# Patient Record
Sex: Female | Born: 1960
Health system: Southern US, Community
[De-identification: ages and names within clinical notes are randomized; demographics above are authoritative.]

## PROBLEM LIST (undated history)

## (undated) DIAGNOSIS — M1990 Unspecified osteoarthritis, unspecified site: Secondary | ICD-10-CM

## (undated) DIAGNOSIS — R011 Cardiac murmur, unspecified: Secondary | ICD-10-CM

## (undated) DIAGNOSIS — M170 Bilateral primary osteoarthritis of knee: Secondary | ICD-10-CM

## (undated) DIAGNOSIS — F419 Anxiety disorder, unspecified: Secondary | ICD-10-CM

## (undated) DIAGNOSIS — M810 Age-related osteoporosis without current pathological fracture: Secondary | ICD-10-CM

## (undated) HISTORY — DX: Age-related osteoporosis without current pathological fracture: M81.0

## (undated) HISTORY — DX: Unspecified osteoarthritis, unspecified site: M19.90

## (undated) HISTORY — DX: Anxiety disorder, unspecified: F41.9

## (undated) HISTORY — DX: Bilateral primary osteoarthritis of knee: M17.0

## (undated) HISTORY — DX: Cardiac murmur, unspecified: R01.1

---

## 2010-08-23 LAB — HM COLONOSCOPY

## 2014-11-23 LAB — HM DEXA SCAN

## 2015-11-16 LAB — HM PAP SMEAR: HM Pap smear: NEGATIVE

## 2016-07-20 LAB — VITAMIN D 25 HYDROXY (VIT D DEFICIENCY, FRACTURES): Vit D, 25-Hydroxy: 61

## 2016-07-20 LAB — CBC AND DIFFERENTIAL
HCT: 42 (ref 36–46)
HEMOGLOBIN: 14.2 (ref 12.0–16.0)
Platelets: 238 (ref 150–399)
WBC: 5.3

## 2016-07-20 LAB — VITAMIN B12: Vitamin B-12: 462

## 2016-07-20 LAB — LIPID PANEL
Cholesterol: 170 (ref 0–200)
HDL: 59 (ref 35–70)
LDL Cholesterol: 90
Triglycerides: 113 (ref 40–160)

## 2016-07-20 LAB — HEPATIC FUNCTION PANEL
ALT: 22 (ref 7–35)
AST: 22 (ref 13–35)

## 2016-07-20 LAB — BASIC METABOLIC PANEL
Glucose: 91
Potassium: 4.2 (ref 3.4–5.3)
Sodium: 139 (ref 137–147)

## 2016-07-20 LAB — TSH: TSH: 3.85 (ref <=5.90)

## 2016-08-10 LAB — HM MAMMOGRAPHY

## 2017-09-18 ENCOUNTER — Encounter: Payer: Self-pay | Admitting: *Deleted

## 2017-09-19 ENCOUNTER — Telehealth: Payer: Self-pay

## 2017-09-19 NOTE — Telephone Encounter (Signed)
OK but did I agree to take this patient? ie a family member of one of my patients? Just want to confirm they know my practice is full except for family member.

## 2017-09-19 NOTE — Telephone Encounter (Signed)
Copied from CRM [id number]. Topic: Appointment Scheduling - New Patient >> Sep 18, 2017  5:13 PM Stephannie LiSimmons, Janett L, [place] wrote: New patient has been scheduled for your office. Provider: Abner GreenspanBlyth  Date of Appointment: 01/07/18  Route to department's PEC pool.

## 2017-09-20 NOTE — Telephone Encounter (Signed)
Provider is not excepting new patients at this time. Is the patient a family member of one of her patients? If not she is not taking any one new.

## 2017-10-01 NOTE — Telephone Encounter (Signed)
I remember her I think, very nice. Let her know I think she would do well with Dr Patsy Lager if she is taking patientsand that is who sees my kids. Once she is in the office if for some reason she wants to transfer we make that happen sometimes.

## 2017-10-01 NOTE — Telephone Encounter (Signed)
Author phoned pt. to confirm new patient status and inquire into how she was referred. Pt. stated she met Dr. Charlett Blake at the dentist office, but does not have any relatives who currently are active patients of Dr. Charlett Blake. Chief Strategy Officer apologized and stated that Dr. Charlett Blake is not taking new patients at this time, but will forward to Dr. Charlett Blake for final determination. Pt. made aware that she may need to be placed with another provider who is accepting new patients, female provider preferred. Routed to Dr. Charlett Blake to review.

## 2017-10-02 ENCOUNTER — Telehealth: Payer: Self-pay | Admitting: Emergency Medicine

## 2017-10-02 NOTE — Telephone Encounter (Signed)
Please advise  Copied from CRM [id number]. Topic: Quick Communication - See Telephone Encounter >> Oct 02, 2017 12:56 PM Waymon Amato wrote: Pt is wanting to become a new patient of dr Beverely Low   Please call [phone] once a decision has been made

## 2017-10-02 NOTE — Telephone Encounter (Signed)
Author phoned pt. to relay that no female providers at this time are accepting new patients. Author left detailed VM, redirecting her to [place] [facility], as home address is listed [place], with phone number given. Chartered loss adjuster apologized and routed to [facility] as FYI so as to prevent future oversights in scheduling new patients.

## 2017-10-03 NOTE — Telephone Encounter (Signed)
LM advising pt that [facility] could not accept at this time and recommended Dr. Mardelle MatteAndy.

## 2017-10-03 NOTE — Telephone Encounter (Signed)
Pt originally wanted to see Dr Abner GreenspanBlyth.  Not sure how my name got brought up but I am not able to accept at this time.  Would recommend Dr Mardelle MatteAndy

## 2018-01-03 ENCOUNTER — Ambulatory Visit (INDEPENDENT_AMBULATORY_CARE_PROVIDER_SITE_OTHER): Payer: BLUE CROSS/BLUE SHIELD | Admitting: Family Medicine

## 2018-01-03 ENCOUNTER — Encounter: Payer: Self-pay | Admitting: Family Medicine

## 2018-01-03 ENCOUNTER — Other Ambulatory Visit: Payer: Self-pay

## 2018-01-03 VITALS — BP 122/88 | HR 97 | Temp 97.6°F | Ht 63.5 in | Wt 184.0 lb

## 2018-01-03 DIAGNOSIS — Z1239 Encounter for other screening for malignant neoplasm of breast: Secondary | ICD-10-CM | POA: Diagnosis not present

## 2018-01-03 DIAGNOSIS — Z23 Encounter for immunization: Secondary | ICD-10-CM

## 2018-01-03 DIAGNOSIS — Z0000 Encounter for general adult medical examination without abnormal findings: Secondary | ICD-10-CM

## 2018-01-03 DIAGNOSIS — M810 Age-related osteoporosis without current pathological fracture: Secondary | ICD-10-CM | POA: Diagnosis not present

## 2018-01-03 DIAGNOSIS — M170 Bilateral primary osteoarthritis of knee: Secondary | ICD-10-CM

## 2018-01-03 HISTORY — DX: Bilateral primary osteoarthritis of knee: M17.0

## 2018-01-03 NOTE — Progress Notes (Signed)
Subjective  Chief Complaint  Patient presents with  . Establish Care    Relocated here from [place],moved 07/2016 last physcal 06/2016   . Annual Exam    doing well, no complaints     HPI: Madison Ray is a 57 y.o. female who presents to [facility] today for a Female Wellness Visit.   Wellness Visit: annual visit with health maintenance review and exam without Pap   Very pleasant 57 yo [phone] married female who lives in [place] with her husband who is retired now. She is working again: loves her job but high stress due to her boss. Has a daughter who lives here with her family. Happy.   Reviewed multiple old records. Very healthy. Early osteoporosis on meds with some improvement on last dex 2017 (now in osteopenic range). On fosamax since 2014 but hasn't taken regularly in the last year. On ca and vit D. Was exercising regularly but has decreased due to work.   Obesity: working on weight loss again.   HM: due mammo, dexa, tdap. Last pap 2018 she thinks.   ROS + knee pain intermittently  Assessment  1. Annual physical exam   2. Osteoporosis without current pathological fracture, unspecified osteoporosis type   3. Breast cancer screening   4. Primary osteoarthritis of both knees      Plan  Female Wellness Visit:  Age appropriate Health Maintenance and Prevention measures were discussed with patient. Included topics are cancer screening recommendations, ways to keep healthy (see AVS) including dietary and exercise recommendations, regular eye and dental care, use of seat belts, and avoidance of moderate alcohol use and tobacco use.  mammo and dexa ordered.   BMI: discussed patient's BMI and encouraged positive lifestyle modifications to help get to or maintain a target BMI.  HM needs and immunizations were addressed and ordered. See below for orders. See HM and immunization section for updates. tdap today  Routine labs and screening  tests ordered including cmp, cbc and lipids where appropriate.  Discussed recommendations regarding Vit D and calcium supplementation (see AVS)  Will continue fosamax if dexa is worsening x 1 more year; drug holiday if stable.   Follow up: 12 months for cpe.   Orders Placed This Encounter  Procedures  . MM DIGITAL SCREENING BILATERAL  . DG Bone Density  . HM DEXA SCAN  . Tdap vaccine greater than or equal to 7yo IM  . CBC with Differential/Platelet  . Comprehensive metabolic panel  . Lipid panel  . HIV Antibody (routine testing w rflx)  . Hepatitis C antibody  . TSH  . VITAMIN D 25 Hydroxy (Vit-D Deficiency, Fractures)  . CBC and differential  . VITAMIN D 25 Hydroxy (Vit-D Deficiency, Fractures)  . Basic metabolic panel  . Lipid panel  . Hepatic function panel  . Vitamin B12  . TSH  . Basic metabolic panel   No orders of the defined types were placed in this encounter.    Lifestyle: Body mass index is 32.08 kg/m. Wt Readings from Last 3 Encounters:  01/03/18 184 lb (83.5 kg)   Diet: general Exercise: frequently,   Patient Active Problem List   Diagnosis Date Noted  . Osteoporosis 01/03/2018    DEXA 2014 t = -2.6 lowest; started fosamax and imprved  DEXA 2016 t = -2.2 lowest, improved on fosamax    . Osteoarthritis of knees, bilateral 01/03/2018    Clinical diagnosis    Health Maintenance  Topic Date Due  .  DEXA SCAN  [birth year]  . Hepatitis C Screening  [birth year]  . HIV Screening  01/01/1974  . MAMMOGRAM  01/01/1977  . PAP SMEAR-Modifier  11/23/2019  . COLONOSCOPY  08/22/2020  . TETANUS/TDAP  01/04/2028  . INFLUENZA VACCINE  Completed   Immunization History  Administered Date(s) Administered  . Influenza, Quadrivalent, Recombinant, Inj, Pf 10/28/2017  . Tdap 01/03/2018  . Zoster Recombinat (Shingrix) 11/08/2016, 01/08/2017   We updated and reviewed the patient's past history in detail and it is documented below. Allergies: Patient has No Known  Allergies. Past Medical History Patient  has a past medical history of Arthritis, Osteoarthritis of knees, bilateral (01/03/2018), and Osteoporosis. Past Surgical History Patient  has no past surgical history on file. Family History: Patient family history includes Arthritis in her mother and sister; COPD in her mother; Heart attack in her father; Hyperlipidemia in her father and mother; Hypertension in her father and mother; Stroke in her mother. Social History:  Patient  reports that she has never smoked. She has never used smokeless tobacco. She reports previous alcohol use. She reports that she does not use drugs.  Review of Systems: Constitutional: negative for fever or malaise Ophthalmic: negative for photophobia, double vision or loss of vision Cardiovascular: negative for chest pain, dyspnea on exertion, or new LE swelling Respiratory: negative for SOB or persistent cough Gastrointestinal: negative for abdominal pain, change in bowel habits or melena Genitourinary: negative for dysuria or gross hematuria, no abnormal uterine bleeding or disharge Musculoskeletal: negative for new gait disturbance or muscular weakness Integumentary: negative for new or persistent rashes, no breast lumps Neurological: negative for TIA or stroke symptoms Psychiatric: negative for SI or delusions Allergic/Immunologic: negative for hives  Patient Care Team    Relationship Specialty Notifications Start End  Willow Ora, MD PCP - General Family Medicine  01/03/18     Objective  Vitals: BP 122/88   Pulse 97   Temp 97.6 F (36.4 C)   Ht 5' 3.5" (1.613 m)   Wt 184 lb (83.5 kg)   SpO2 97%   BMI 32.08 kg/m  General:  Well developed, well nourished, no acute distress  Psych:  Alert and orientedx3,normal mood and affect HEENT:  Normocephalic, atraumatic, non-icteric sclera, PERRL, oropharynx is clear without mass or exudate, supple neck without adenopathy, mass or thyromegaly Cardiovascular:   Normal S1, S2, RRR without gallop, rub or murmur, nondisplaced PMI Respiratory:  Good breath sounds bilaterally, CTAB with normal respiratory effort Gastrointestinal: normal bowel sounds, soft, non-tender, no noted masses. No HSM MSK: no deformities, contusions. Joints are without erythema or swelling. Spine and CVA region are nontender Skin:  Warm, no rashes or suspicious lesions noted, sun changes noted on back and chest Neurologic:    Mental status is normal. CN 2-11 are normal. Gross motor and sensory exams are normal. Normal gait. No tremor Breast Exam: No mass, skin retraction or nipple discharge is appreciated in either breast. No axillary adenopathy. Fibrocystic changes are not noted    Commons side effects, risks, benefits, and alternatives for medications and treatment plan prescribed today were discussed, and the patient expressed understanding of the given instructions. Patient is instructed to call or message via MyChart if he/she has any questions or concerns regarding our treatment plan. No barriers to understanding were identified. We discussed Red Flag symptoms and signs in detail. Patient expressed understanding regarding what to do in case of urgent or emergency type symptoms.   Medication list was reconciled, printed and provided to  the patient in AVS. Patient instructions and summary information was reviewed with the patient as documented in the AVS. This note was prepared with assistance of Dragon voice recognition software. Occasional wrong-word or sound-a-like substitutions may have occurred due to the inherent limitations of voice recognition software

## 2018-01-03 NOTE — Patient Instructions (Signed)
Please return in 12 months for your annual complete physical; please come fasting.  I will release your lab results to you on your MyChart account with further instructions. Please reply with any questions.   We will call you with information regarding your referral appointment. Mammogram and bone density testing.  If you do not hear from us within the next 2 weeks, please let me know. It can take 1-2 weeks to get appointments set up with the specialists.   You may try glucosamine for your knee pain.   It was a pleasure meeting you today! Thank you for choosing us to meet your healthcare needs! I truly look forward to working with you. If you have any questions or concerns, please send me a message via Mychart or call the office at [phone].  Please do these things to maintain good health!   Exercise at least 30-45 minutes a day,  4-5 days a week.   Eat a low-fat diet with lots of fruits and vegetables, up to 7-9 servings per day.  Drink plenty of water daily. Try to drink 8 8oz glasses per day.  Seatbelts can save your life. Always wear your seatbelt.  Place Smoke Detectors on every level of your home and check batteries every year.  Schedule an appointment with an eye doctor for an eye exam every 1-2 years  Safe sex - use condoms to protect yourself from STDs if you could be exposed to these types of infections. Use birth control if you do not want to become pregnant and are sexually active.  Avoid heavy alcohol use. If you drink, keep it to less than 2 drinks/day and not every day.  Health Care Power of Attorney.  Choose someone you trust that could speak for you if you became unable to speak for yourself.  Depression is common in our stressful world.If you're feeling down or losing interest in things you normally enjoy, please come in for a visit.  If anyone is threatening or hurting you, please get help. Physical or Emotional Violence is never OK.

## 2018-01-05 LAB — COMPREHENSIVE METABOLIC PANEL
AG Ratio: 1.7 (calc) (ref 1.0–2.5)
ALT: 22 U/L (ref 6–29)
AST: 26 U/L (ref 10–35)
Albumin: 4.4 g/dL (ref 3.6–5.1)
Alkaline phosphatase (APISO): 96 U/L (ref 33–130)
BUN: 19 mg/dL (ref 7–25)
CHLORIDE: 107 mmol/L (ref 98–110)
CO2: 22 mmol/L (ref 20–32)
Calcium: 9.6 mg/dL (ref 8.6–10.4)
Creat: 0.92 mg/dL (ref 0.50–1.05)
Globulin: 2.6 g/dL (calc) (ref 1.9–3.7)
Glucose, Bld: 115 mg/dL — ABNORMAL HIGH (ref 65–99)
Potassium: 4 mmol/L (ref 3.5–5.3)
Sodium: 139 mmol/L (ref 135–146)
Total Bilirubin: 1 mg/dL (ref 0.2–1.2)
Total Protein: 7 g/dL (ref 6.1–8.1)

## 2018-01-05 LAB — CBC WITH DIFFERENTIAL/PLATELET
Basophils Absolute: 20 cells/uL (ref 0–200)
Basophils Relative: 0.3 %
Eosinophils Absolute: 112 cells/uL (ref 15–500)
Eosinophils Relative: 1.7 %
HCT: 44.2 % (ref 35.0–45.0)
Hemoglobin: 15.3 g/dL (ref 11.7–15.5)
Lymphs Abs: 2970 cells/uL (ref 850–3900)
MCH: 29 pg (ref 27.0–33.0)
MCHC: 34.6 g/dL (ref 32.0–36.0)
MCV: 83.7 fL (ref 80.0–100.0)
MPV: 11.2 fL (ref 7.5–12.5)
Monocytes Relative: 8.8 %
Neutro Abs: 2917 cells/uL (ref 1500–7800)
Neutrophils Relative %: 44.2 %
Platelets: 284 10*3/uL (ref 140–400)
RBC: 5.28 10*6/uL — ABNORMAL HIGH (ref 3.80–5.10)
RDW: 14.1 % (ref 11.0–15.0)
Total Lymphocyte: 45 %
WBC mixed population: 581 cells/uL (ref 200–950)
WBC: 6.6 10*3/uL (ref 3.8–10.8)

## 2018-01-05 LAB — LIPID PANEL
Cholesterol: 203 mg/dL — ABNORMAL HIGH (ref <200)
HDL: 52 mg/dL (ref >50)
LDL Cholesterol (Calc): 114 mg/dL (calc) — ABNORMAL HIGH
Non-HDL Cholesterol (Calc): 151 mg/dL (calc) — ABNORMAL HIGH (ref <130)
Total CHOL/HDL Ratio: 3.9 (calc) (ref <5.0)
Triglycerides: 246 mg/dL — ABNORMAL HIGH (ref <150)

## 2018-01-05 LAB — TSH: TSH: 2.03 mIU/L (ref 0.40–4.50)

## 2018-01-05 LAB — HEPATITIS C ANTIBODY
Hepatitis C Ab: NONREACTIVE
SIGNAL TO CUT-OFF: 0.01 (ref <1.00)

## 2018-01-05 LAB — VITAMIN D 25 HYDROXY (VIT D DEFICIENCY, FRACTURES): Vit D, 25-Hydroxy: 45 ng/mL (ref 30–100)

## 2018-01-05 LAB — HIV ANTIBODY (ROUTINE TESTING W REFLEX): HIV 1&2 Ab, 4th Generation: NONREACTIVE

## 2018-01-06 ENCOUNTER — Encounter: Payer: Self-pay | Admitting: Family Medicine

## 2018-01-06 NOTE — Progress Notes (Signed)
Please call patient: I have reviewed his/her lab results. Everything looks good. Vit D is normal. Cholesterol is ok as are kidney, thyroid and liver tests. No changes recommended at this time  The 10-year ASCVD risk score Denman George(Goff DC Montez HagemanJr., et al., 2013) is: 2.9%   Values used to calculate the score:     Age: [age] years     Sex: Female     Is Non-Hispanic African American: No     Diabetic: No     Tobacco smoker: No     Systolic Blood Pressure: 122 mmHg     Is BP treated: No     HDL Cholesterol: 52 mg/dL     Total Cholesterol: 203 mg/dL

## 2018-01-07 ENCOUNTER — Ambulatory Visit: Payer: Self-pay | Admitting: Family Medicine

## 2018-01-09 ENCOUNTER — Encounter: Payer: Self-pay | Admitting: Obstetrics and Gynecology

## 2018-01-13 ENCOUNTER — Encounter: Payer: Self-pay | Admitting: Family Medicine

## 2018-01-27 ENCOUNTER — Encounter: Payer: Self-pay | Admitting: *Deleted

## 2018-01-27 NOTE — Progress Notes (Signed)
11/15/2015

## 2018-01-28 ENCOUNTER — Other Ambulatory Visit: Payer: Self-pay

## 2018-01-28 ENCOUNTER — Encounter: Payer: Self-pay | Admitting: Family Medicine

## 2018-01-28 ENCOUNTER — Ambulatory Visit: Payer: BLUE CROSS/BLUE SHIELD | Admitting: Family Medicine

## 2018-01-28 VITALS — BP 124/82 | HR 74 | Temp 98.3°F | Resp 16 | Ht 63.5 in | Wt 185.4 lb

## 2018-01-28 DIAGNOSIS — R1012 Left upper quadrant pain: Secondary | ICD-10-CM

## 2018-01-28 DIAGNOSIS — R1013 Epigastric pain: Secondary | ICD-10-CM | POA: Diagnosis not present

## 2018-01-28 MED ORDER — OMEPRAZOLE 20 MG PO CPDR: 20.0000 mg | DELAYED_RELEASE_CAPSULE | Freq: Every day | ORAL | 0 refills | Status: DC

## 2018-01-28 NOTE — Progress Notes (Signed)
Subjective  CC:  Chief Complaint  Patient presents with  . Possible Lactose Intolerance    Pain in LUQ, worse after eating dairy.. Denies diarrhea, stomach pain, and gas.Marland Kitchen Has been using Tums with good relief    HPI: Madison Ray is a 58 y.o. female who presents to the office today to address the problems listed above in the chief complaint.  Healthy 58 year old female presents with history of left upper quadrant and midepigastric pain.  She reports that in the evening of New Year's she ate a heavy meal including lasagna, heavy dairy products and 1 alcoholic beverage.  The next morning she awoke with left upper chest pain radiating to the left shoulder.  She was tender in the left upper quadrant.  Decreased appetite with some belching.  Denies reflux, nausea, vomiting, hematemesis, melena or lower abdominal pain.  Symptoms were present for about 48 to 72 hours.  They are now improved.  They were completely removed relieved by Tums.  She try to figure out what food groups were bothering her most and thought dairy was contributing to pain.  She does not regularly drink alcohol.  She does not take NSAIDs.  She has no history of GERD or reflux.  Of note, she has been under a lot of work stress that is recently improved.  Had recent CPE with normal labs.  No cardiovascular risk factors.  No exertional symptoms. Assessment  1. Midepigastric pain   2. LUQ abdominal pain      Plan   Dyspepsia: Symptoms most consistent with dyspepsia or gastritis brought on by stress and heavy meals.  Much improved now.  Start Prilosec daily for 2 to 4 weeks.  Check lab work including renal, liver and lipase.  Patient to follow-up if develops any red flag symptoms including chest pain or shortness of breath.  If her symptoms persist or do not resolve, she will return for further evaluation.  Follow up: Return if symptoms worsen or fail to improve.  Visit date not found  Orders Placed This Encounter  Procedures    . CBC with Differential/Platelet  . Comprehensive metabolic panel  . Lipase   Meds ordered this encounter  Medications  . omeprazole (PRILOSEC) 20 MG capsule    Sig: Take 1 capsule (20 mg total) by mouth daily.    Dispense:  30 capsule    Refill:  0      I reviewed the patients updated PMH, FH, and SocHx.    Patient Active Problem List   Diagnosis Date Noted  . Osteoporosis 01/03/2018  . Osteoarthritis of knees, bilateral 01/03/2018   Current Meds  Medication Sig  . alendronate (FOSAMAX) 70 MG tablet alendronate 70 mg tablet  . calcium carbonate (OS-CAL - DOSED IN MG OF ELEMENTAL CALCIUM) 1250 (500 Ca) MG tablet Take 1 tablet by mouth.  . calcium-vitamin D (OSCAL WITH D) 500-200 MG-UNIT tablet Take 1 tablet by mouth.    Allergies: Patient has No Known Allergies. Family History: Patient family history includes Arthritis in her mother and sister; COPD in her mother; Heart attack in her father; Hyperlipidemia in her father and mother; Hypertension in her father and mother; Stroke in her mother. Social History:  Patient  reports that she has never smoked. She has never used smokeless tobacco. She reports previous alcohol use. She reports that she does not use drugs.  Review of Systems: Constitutional: Negative for fever malaise or anorexia Cardiovascular: negative for chest pain Respiratory: negative for SOB or persistent  cough Gastrointestinal: negative for nausea, vomiting, diarrhea pain  Objective  Vitals: BP 124/82   Pulse 74   Temp 98.3 F (36.8 C) (Oral)   Resp 16   Ht 5' 3.5" (1.613 m)   Wt 185 lb 6.4 oz (84.1 kg)   SpO2 98%   BMI 32.33 kg/m  General: no acute distress , A&Ox3 HEENT: PEERL, conjunctiva normal, Oropharynx moist,neck is supple Cardiovascular:  RRR without murmur or gallop.  Respiratory:  Good breath sounds bilaterally, CTAB with normal respiratory effort Gastrointestinal: soft, flat abdomen, normal active bowel sounds, no palpable masses, no  hepatosplenomegaly, no appreciated hernias Mild midepigastric tenderness without rebound, guarding or masses.  No right upper quadrant tenderness. Skin:  Warm, no rashes     Commons side effects, risks, benefits, and alternatives for medications and treatment plan prescribed today were discussed, and the patient expressed understanding of the given instructions. Patient is instructed to call or message via MyChart if he/she has any questions or concerns regarding our treatment plan. No barriers to understanding were identified. We discussed Red Flag symptoms and signs in detail. Patient expressed understanding regarding what to do in case of urgent or emergency type symptoms.   Medication list was reconciled, printed and provided to the patient in AVS. Patient instructions and summary information was reviewed with the patient as documented in the AVS. This note was prepared with assistance of Dragon voice recognition software. Occasional wrong-word or sound-a-like substitutions may have occurred due to the inherent limitations of voice recognition software

## 2018-01-28 NOTE — Patient Instructions (Signed)
Please return if not improving.  Take the omeprazole daily for the next 2-4 weeks. It should resolve your symptoms.   If you have any questions or concerns, please don't hesitate to send me a message via MyChart or call the office at [phone]. Thank you for visiting with [place] today! It's our pleasure caring for you.   Indigestion Indigestion is a feeling of pain, discomfort, burning, or fullness in the upper part of your abdomen. It can come and go. It may occur frequently or rarely. Indigestion tends to occur while you are eating or right after you have finished eating. It may be worse at night and while bending over or lying down. Indigestion may be a symptom of an underlying digestive condition. Follow these instructions at home: Eating and drinking   Follow an eating plan as recommended by your health care provider.  Avoid certain foods and drinks as told by your health care provider. This may include: ? Chocolate and cocoa. ? Peppermint and mint flavorings. ? Garlic and onions. ? Horseradish. ? Spicy and acidic foods, including peppers, chili powder, curry powder, vinegar, hot sauces, and barbecue sauce. ? Citrus fruits, such as oranges, lemons, and limes. ? Tomato-based foods, such as red sauce, chili, salsa, and pizza with red sauce. ? Fried and fatty foods, such as donuts, french fries, potato chips, and high-fat dressings. ? High-fat meats, such as hot dogs and fatty cuts of red and white meats, such as rib eye steak, sausage, ham, and bacon. ? High-fat dairy items, such as whole milk, butter, and cream cheese. ? Coffee and tea (with or without caffeine). ? Drinks that contain alcohol. ? Energy drinks and sports drinks. ? Carbonated drinks or sodas. ? Citrus fruit juices.  Eat small, frequent meals instead of large meals.  Avoid drinking large amounts of liquid with your meals.  Avoid eating meals during the 2-3 hours before bedtime.  Avoid lying down right after you  eat.  Avoid exercise for 2 hours after you eat. Lifestyle      Maintain a healthy weight. Ask your health care provider what weight is healthy for you. If you need to lose weight, work with your health care provider to do so safely.  Exercise for at least 30 minutes on 5 or more days each week, or as told by your health care provider. Avoid exercises that include bending forward. This can make your symptoms worse.  Wear loose-fitting clothing. Do not wear anything tight around your waist that causes pressure on your abdomen.  Do not use any products that contain nicotine or tobacco, such as cigarettes, e-cigarettes, and chewing tobacco. These can make symptoms worse. If you need help quitting, ask your health care provider.  Raise (elevate) the head of your bed about 6 inches (15 cm) when you sleep.  Try to reduce your stress, such as with yoga or meditation. If you need help reducing stress, ask your health care provider. General instructions  Take over-the-counter and prescription medicines only as told by your health care provider. Do not take aspirin, ibuprofen, or other NSAIDs unless your health care provider told you to do so.  Pay attention to any changes in your symptoms.  Keep all follow-up visits as told by your health care provider. This is important. Contact a health care provider if:  You have new symptoms.  You have unexplained weight loss.  You have difficulty swallowing, or it hurts to swallow.  Your symptoms do not improve with treatment.  Your symptoms last for more than 2 days.  You have a fever.  You vomit. Get help right away if:  You have pain in your arms, neck, jaw, teeth, or back.  You feel sweaty, dizzy, or light-headed.  You faint.  You have chest pain or shortness of breath.  You cannot stop vomiting, or you vomit blood.  Your stool is bloody or black.  You have severe pain in your abdomen. These symptoms may represent a serious  problem that is an emergency. Do not wait to see if the symptoms will go away. Get medical help right away. Call your local emergency services (911 in the [place] Do not drive yourself to the hospital. Summary  Indigestion is a feeling of pain, discomfort, burning, or fullness in the upper part of your abdomen. It tends to occur while you are eating or right after you have finished eating.  Follow an eating plan and other lifestyle changes as told by your health care provider.  Take over-the-counter and prescription medicines only as told by your health care provider. Do not take aspirin, ibuprofen, or other NSAIDs unless your health care provider told you to do so.  Contact your health care provider if your symptoms do not get better or they get worse. Some symptoms may represent a serious problem that is an emergency. Do not wait to see if the symptoms will go away. Get medical help right away. This information is not intended to replace advice given to you by your health care provider. Make sure you discuss any questions you have with your health care provider. Document Released: 02/16/2004 Document Revised: 06/10/2017 Document Reviewed: 06/10/2017 Elsevier Interactive Patient Education  2019 [employer].

## 2018-01-29 LAB — COMPREHENSIVE METABOLIC PANEL
ALT: 15 U/L (ref 0–35)
AST: 23 U/L (ref 0–37)
Albumin: 4.1 g/dL (ref 3.5–5.2)
Alkaline Phosphatase: 82 U/L (ref 39–117)
BUN: 18 mg/dL (ref 6–23)
CHLORIDE: 103 meq/L (ref 96–112)
CO2: 29 mEq/L (ref 19–32)
CREATININE: 0.97 mg/dL (ref 0.40–1.20)
Calcium: 9.7 mg/dL (ref 8.4–10.5)
GFR: 62.46 mL/min (ref >60.00)
GLUCOSE: 90 mg/dL (ref 70–99)
Potassium: 4.2 mEq/L (ref 3.5–5.1)
Sodium: 139 mEq/L (ref 135–145)
Total Bilirubin: 0.7 mg/dL (ref 0.2–1.2)
Total Protein: 7.3 g/dL (ref 6.0–8.3)

## 2018-01-29 LAB — CBC WITH DIFFERENTIAL/PLATELET
Basophils Absolute: 0 10*3/uL (ref 0.0–0.1)
Basophils Relative: 0.5 % (ref 0.0–3.0)
Eosinophils Absolute: 0 10*3/uL (ref 0.0–0.7)
Eosinophils Relative: 0.7 % (ref 0.0–5.0)
HCT: 42.1 % (ref 36.0–46.0)
Hemoglobin: 14.4 g/dL (ref 12.0–15.0)
Lymphocytes Relative: 36.8 % (ref 12.0–46.0)
Lymphs Abs: 2.4 10*3/uL (ref 0.7–4.0)
MCHC: 34.3 g/dL (ref 30.0–36.0)
MCV: 84.5 fl (ref 78.0–100.0)
Monocytes Absolute: 0.7 10*3/uL (ref 0.1–1.0)
Monocytes Relative: 10.8 % (ref 3.0–12.0)
NEUTROS PCT: 51.2 % (ref 43.0–77.0)
Neutro Abs: 3.3 10*3/uL (ref 1.4–7.7)
Platelets: 301 10*3/uL (ref 150.0–400.0)
RBC: 4.98 Mil/uL (ref 3.87–5.11)
RDW: 14.1 % (ref 11.5–15.5)
WBC: 6.5 10*3/uL (ref 4.0–10.5)

## 2018-01-29 LAB — LIPASE: Lipase: 12 U/L (ref 11.0–59.0)

## 2018-02-20 ENCOUNTER — Other Ambulatory Visit: Payer: Self-pay | Admitting: Family Medicine

## 2018-03-03 ENCOUNTER — Ambulatory Visit
Admission: RE | Admit: 2018-03-03 | Discharge: 2018-03-03 | Disposition: A | Discharge status: Home / Self Care | Payer: BLUE CROSS/BLUE SHIELD | Source: Ambulatory Visit | Attending: Family Medicine | Admitting: Family Medicine

## 2018-03-03 DIAGNOSIS — Z1231 Encounter for screening mammogram for malignant neoplasm of breast: Secondary | ICD-10-CM | POA: Diagnosis not present

## 2018-03-03 DIAGNOSIS — Z1239 Encounter for other screening for malignant neoplasm of breast: Secondary | ICD-10-CM

## 2018-03-03 DIAGNOSIS — M8588 Other specified disorders of bone density and structure, other site: Secondary | ICD-10-CM | POA: Diagnosis not present

## 2018-03-03 DIAGNOSIS — M810 Age-related osteoporosis without current pathological fracture: Secondary | ICD-10-CM

## 2018-03-03 DIAGNOSIS — Z780 Asymptomatic menopausal state: Secondary | ICD-10-CM | POA: Diagnosis not present

## 2019-04-21 ENCOUNTER — Telehealth: Payer: Self-pay | Admitting: Family Medicine

## 2019-04-21 ENCOUNTER — Other Ambulatory Visit: Payer: Self-pay | Admitting: Family Medicine

## 2019-04-21 DIAGNOSIS — Z1231 Encounter for screening mammogram for malignant neoplasm of breast: Secondary | ICD-10-CM

## 2019-04-21 NOTE — Telephone Encounter (Signed)
Patient is aware that mammogram is already ordered and scheduled

## 2019-04-21 NOTE — Telephone Encounter (Signed)
Pt called requesting orders to have mammogram done. Please advise.

## 2019-05-27 ENCOUNTER — Ambulatory Visit
Admission: RE | Admit: 2019-05-27 | Discharge: 2019-05-27 | Disposition: A | Discharge status: Home / Self Care | Payer: 59 | Source: Ambulatory Visit | Attending: Family Medicine | Admitting: Family Medicine

## 2019-05-27 ENCOUNTER — Other Ambulatory Visit: Payer: Self-pay

## 2019-05-27 DIAGNOSIS — Z1231 Encounter for screening mammogram for malignant neoplasm of breast: Secondary | ICD-10-CM

## 2019-07-03 ENCOUNTER — Encounter: Payer: Self-pay | Admitting: Family Medicine

## 2019-07-03 ENCOUNTER — Ambulatory Visit (INDEPENDENT_AMBULATORY_CARE_PROVIDER_SITE_OTHER): Payer: 59 | Admitting: Family Medicine

## 2019-07-03 ENCOUNTER — Other Ambulatory Visit: Payer: Self-pay

## 2019-07-03 VITALS — BP 118/80 | HR 64 | Temp 98.0°F | Resp 18 | Ht 64.0 in | Wt 167.4 lb

## 2019-07-03 DIAGNOSIS — M810 Age-related osteoporosis without current pathological fracture: Secondary | ICD-10-CM

## 2019-07-03 DIAGNOSIS — M170 Bilateral primary osteoarthritis of knee: Secondary | ICD-10-CM

## 2019-07-03 DIAGNOSIS — Z0000 Encounter for general adult medical examination without abnormal findings: Secondary | ICD-10-CM | POA: Diagnosis not present

## 2019-07-03 LAB — CBC WITH DIFFERENTIAL/PLATELET
Basophils Absolute: 0 10*3/uL (ref 0.0–0.1)
Basophils Relative: 0.3 % (ref 0.0–3.0)
Eosinophils Absolute: 0 10*3/uL (ref 0.0–0.7)
Eosinophils Relative: 0.3 % (ref 0.0–5.0)
HCT: 42.1 % (ref 36.0–46.0)
Hemoglobin: 14.3 g/dL (ref 12.0–15.0)
Lymphocytes Relative: 40.4 % (ref 12.0–46.0)
Lymphs Abs: 2 10*3/uL (ref 0.7–4.0)
MCHC: 33.9 g/dL (ref 30.0–36.0)
MCV: 88.5 fl (ref 78.0–100.0)
Monocytes Absolute: 0.5 10*3/uL (ref 0.1–1.0)
Monocytes Relative: 9.7 % (ref 3.0–12.0)
Neutro Abs: 2.4 10*3/uL (ref 1.4–7.7)
Neutrophils Relative %: 49.3 % (ref 43.0–77.0)
Platelets: 250 10*3/uL (ref 150.0–400.0)
RBC: 4.76 Mil/uL (ref 3.87–5.11)
RDW: 14.3 % (ref 11.5–15.5)
WBC: 5 10*3/uL (ref 4.0–10.5)

## 2019-07-03 LAB — LIPID PANEL
Cholesterol: 183 mg/dL (ref 0–200)
HDL: 66.8 mg/dL (ref >39.00)
LDL Cholesterol: 105 mg/dL — ABNORMAL HIGH (ref 0–99)
NonHDL: 116.17
Total CHOL/HDL Ratio: 3
Triglycerides: 56 mg/dL (ref 0.0–149.0)
VLDL: 11.2 mg/dL (ref 0.0–40.0)

## 2019-07-03 LAB — COMPREHENSIVE METABOLIC PANEL
ALT: 14 U/L (ref 0–35)
AST: 19 U/L (ref 0–37)
Albumin: 4.5 g/dL (ref 3.5–5.2)
Alkaline Phosphatase: 72 U/L (ref 39–117)
BUN: 23 mg/dL (ref 6–23)
CO2: 31 mEq/L (ref 19–32)
Calcium: 9.3 mg/dL (ref 8.4–10.5)
Chloride: 102 mEq/L (ref 96–112)
Creatinine, Ser: 0.85 mg/dL (ref 0.40–1.20)
GFR: 68.11 mL/min (ref >60.00)
Glucose, Bld: 89 mg/dL (ref 70–99)
Potassium: 4.2 mEq/L (ref 3.5–5.1)
Sodium: 136 mEq/L (ref 135–145)
Total Bilirubin: 1.2 mg/dL (ref 0.2–1.2)
Total Protein: 6.8 g/dL (ref 6.0–8.3)

## 2019-07-03 LAB — TSH: TSH: 1.84 u[IU]/mL (ref 0.35–4.50)

## 2019-07-03 NOTE — Progress Notes (Signed)
Subjective  Chief Complaint  Patient presents with  . Annual Exam    Fasting labs    HPI: Madison Ray is a 59 y.o. female who presents to [facility] today for a Female Wellness Visit. She also has the concerns and/or needs as listed above in the chief complaint. These will be addressed in addition to the Health Maintenance Visit.   Wellness Visit: annual visit with health maintenance review and exam without Pap   HM: up to date on pap (2017 with neg SIL and neg HPV) and mammo. Eating well and losing weight for son's wedding 2022. Happy. Retired fully now. No new concerns Chronic disease f/u and/or acute problem visit: (deemed necessary to be done in addition to the wellness visit):  Mild knee OA on osteoflex and rare advil.   Osteoporosis with improved dexa 2020 after fosamax. Now on drug holiday since 2020. On calcium and vit d.   Assessment  1. Annual physical exam   2. Age-related osteoporosis without current pathological fracture   3. Primary osteoarthritis of both knees      Plan  Female Wellness Visit:  Age appropriate Health Maintenance and Prevention measures were discussed with patient. Included topics are cancer screening recommendations, ways to keep healthy (see AVS) including dietary and exercise recommendations, regular eye and dental care, use of seat belts, and avoidance of moderate alcohol use and tobacco use.   BMI: discussed patient's BMI and encouraged positive lifestyle modifications to help get to or maintain a target BMI.  HM needs and immunizations were addressed and ordered. See below for orders. See HM and immunization section for updates.  Routine labs and screening tests ordered including cmp, cbc and lipids where appropriate.  Discussed recommendations regarding Vit D and calcium supplementation (see AVS)  Chronic disease management visit and/or acute problem visit:  Drug holiday and recheck dexa next  year  Supportive care for mild OA.   Follow up: Return in about 1 year (around 07/02/2020) for complete physical.  Orders Placed This Encounter  Procedures  . CBC with Differential/Platelet  . Comprehensive metabolic panel  . Lipid panel  . TSH   No orders of the defined types were placed in this encounter.     Lifestyle: Body mass index is 28.73 kg/m. Wt Readings from Last 3 Encounters:  07/03/19 167 lb 6.4 oz (75.9 kg)  01/28/18 185 lb 6.4 oz (84.1 kg)  01/03/18 184 lb (83.5 kg)    Patient Active Problem List   Diagnosis Date Noted  . Osteoporosis 01/03/2018    DEXA 2014 t = -2.6 lowest; started fosamax and imprved  DEXA 2016 t = -2.2 lowest, improved on fosamax  DEXA 02/2018 T = - 1.1 lowest, on fosamax. May consider a drug holiday. To discuss with pt   . Osteoarthritis of knees, bilateral 01/03/2018    Clinical diagnosis    Health Maintenance  Topic Date Due  . INFLUENZA VACCINE  08/23/2019  . DEXA SCAN  03/03/2020  . MAMMOGRAM  05/26/2020  . COLONOSCOPY  08/22/2020  . PAP SMEAR-Modifier  11/14/2020  . TETANUS/TDAP  01/04/2028  . COVID-19 Vaccine  Completed  . Hepatitis C Screening  Completed  . HIV Screening  Completed   Immunization History  Administered Date(s) Administered  . Influenza, Quadrivalent, Recombinant, Inj, Pf 10/28/2017  . Influenza-Unspecified 09/26/2018  . PFIZER SARS-COV-2 Vaccination 03/21/2019, 04/13/2019  . Tdap 01/03/2018  . Zoster Recombinat (Shingrix) 11/08/2016, 01/08/2017   We updated and  reviewed the patient's past history in detail and it is documented below. Allergies: Patient has No Known Allergies. Past Medical History Patient  has a past medical history of Arthritis, Osteoarthritis of knees, bilateral (01/03/2018), and Osteoporosis. Past Surgical History Patient  has no past surgical history on file. Family History: Patient family history includes Arthritis in her mother and sister; COPD in her mother; Heart attack in  her father; Hyperlipidemia in her father and mother; Hypertension in her father and mother; Hyperthyroidism in her daughter; Hypothyroidism in her mother; Stroke in her mother. Social History:  Patient  reports that she has never smoked. She has never used smokeless tobacco. She reports previous alcohol use. She reports that she does not use drugs.  Review of Systems: Constitutional: negative for fever or malaise Ophthalmic: negative for photophobia, double vision or loss of vision Cardiovascular: negative for chest pain, dyspnea on exertion, or new LE swelling Respiratory: negative for SOB or persistent cough Gastrointestinal: negative for abdominal pain, change in bowel habits or melena Genitourinary: negative for dysuria or gross hematuria, no abnormal uterine bleeding or disharge Musculoskeletal: negative for new gait disturbance or muscular weakness Integumentary: negative for new or persistent rashes, no breast lumps Neurological: negative for TIA or stroke symptoms Psychiatric: negative for SI or delusions Allergic/Immunologic: negative for hives  Patient Care Team    Relationship Specialty Notifications Start End  Leamon Arnt, MD PCP - General Family Medicine  01/03/18     Objective  Vitals: BP 118/80   Pulse 64   Temp 98 F (36.7 C) (Temporal)   Resp 18   Ht 5\' 4"  (1.626 m)   Wt 167 lb 6.4 oz (75.9 kg)   SpO2 97%   BMI 28.73 kg/m  General:  Well developed, well nourished, no acute distress  Psych:  Alert and orientedx3,normal mood and affect HEENT:  Normocephalic, atraumatic, non-icteric sclera,  supple neck without adenopathy, mass or thyromegaly Cardiovascular:  Normal S1, S2, RRR without gallop, rub or murmur Respiratory:  Good breath sounds bilaterally, CTAB with normal respiratory effort Gastrointestinal: normal bowel sounds, soft, non-tender, no noted masses. No HSM MSK: no deformities, contusions. Joints are without erythema or swelling.  Skin:  Warm, no  rashes or suspicious lesions noted Neurologic:    Mental status is normal. CN 2-11 are normal. Gross motor and sensory exams are normal. Normal gait. No tremor Breast Exam: No mass, skin retraction or nipple discharge is appreciated in either breast. No axillary adenopathy. Fibrocystic changes are not noted    Commons side effects, risks, benefits, and alternatives for medications and treatment plan prescribed today were discussed, and the patient expressed understanding of the given instructions. Patient is instructed to call or message via MyChart if he/she has any questions or concerns regarding our treatment plan. No barriers to understanding were identified. We discussed Red Flag symptoms and signs in detail. Patient expressed understanding regarding what to do in case of urgent or emergency type symptoms.   Medication list was reconciled, printed and provided to the patient in AVS. Patient instructions and summary information was reviewed with the patient as documented in the AVS. This note was prepared with assistance of Dragon voice recognition software. Occasional wrong-word or sound-a-like substitutions may have occurred due to the inherent limitations of voice recognition software  This visit occurred during the SARS-CoV-2 public health emergency.  Safety protocols were in place, including screening questions prior to the visit, additional usage of staff PPE, and extensive cleaning of exam room while  observing appropriate contact time as indicated for disinfecting solutions.

## 2019-07-03 NOTE — Patient Instructions (Addendum)
Please return in 12 months for your annual complete physical; please come fasting.  I will release your lab results to you on your MyChart account with further instructions. Please reply with any questions.   Glad you are doing well!  If you have any questions or concerns, please don't hesitate to send me a message via MyChart or call the office at [phone]. Thank you for visiting with [place] today! It's our pleasure caring for you.  Please do these things to maintain good health!   Exercise at least 30-45 minutes a day,  4-5 days a week.   Eat a low-fat diet with lots of fruits and vegetables, up to 7-9 servings per day.  Drink plenty of water daily. Try to drink 8 8oz glasses per day.  Seatbelts can save your life. Always wear your seatbelt.  Place Smoke Detectors on every level of your home and check batteries every year.  Schedule an appointment with an eye doctor for an eye exam every 1-2 years  Avoid heavy alcohol use. If you drink, keep it to less than 2 drinks/day and not every day.  Health Care Power of Attorney.  Choose someone you trust that could speak for you if you became unable to speak for yourself.  Depression is common in our stressful world.If you're feeling down or losing interest in things you normally enjoy, please come in for a visit.  If anyone is threatening or hurting you, please get help. Physical or Emotional Violence is never OK.

## 2020-05-25 ENCOUNTER — Ambulatory Visit: Payer: 59 | Admitting: Family Medicine

## 2020-05-25 ENCOUNTER — Encounter: Payer: Self-pay | Admitting: Family Medicine

## 2020-05-25 ENCOUNTER — Other Ambulatory Visit: Payer: Self-pay

## 2020-05-25 VITALS — BP 130/80 | HR 97 | Temp 98.0°F | Resp 16 | Ht 64.0 in | Wt 166.4 lb

## 2020-05-25 DIAGNOSIS — M810 Age-related osteoporosis without current pathological fracture: Secondary | ICD-10-CM

## 2020-05-25 DIAGNOSIS — Z1231 Encounter for screening mammogram for malignant neoplasm of breast: Secondary | ICD-10-CM | POA: Diagnosis not present

## 2020-05-25 DIAGNOSIS — J0100 Acute maxillary sinusitis, unspecified: Secondary | ICD-10-CM | POA: Diagnosis not present

## 2020-05-25 DIAGNOSIS — Z780 Asymptomatic menopausal state: Secondary | ICD-10-CM | POA: Diagnosis not present

## 2020-05-25 MED ORDER — PREDNISONE 20 MG PO TABS
ORAL_TABLET | ORAL | 0 refills | Status: DC
Start: 2020-05-25 — End: 2020-05-25

## 2020-05-25 MED ORDER — AMOXICILLIN-POT CLAVULANATE 875-125 MG PO TABS: 1.0000 | ORAL_TABLET | Freq: Two times a day (BID) | ORAL | 0 refills | Status: DC

## 2020-05-25 MED ORDER — AMOXICILLIN-POT CLAVULANATE 875-125 MG PO TABS
1.0000 | ORAL_TABLET | Freq: Two times a day (BID) | ORAL | 0 refills | Status: DC
Start: 2020-05-25 — End: 2020-05-25

## 2020-05-25 MED ORDER — PREDNISONE 20 MG PO TABS: ORAL_TABLET | ORAL | 0 refills | Status: DC

## 2020-05-25 NOTE — Progress Notes (Signed)
Subjective   CC:  Chief Complaint  Patient presents with  . Sinus Problem    On going for 3 weeks, right ear painful, had a sore throat, has had some yellow mucus when blowing nose, coughing up yellow mucus    HPI: Madison Ray is a 60 y.o. female who presents to the office today to address the problems listed above in the chief complaint.  Patient reports sinus congestion and pressure with thick drainage, mild nonproductive cough, ear pressure without pain, and mild malaise.  Symptoms have been present for several weeks.Shedenies high fevers, GI symptoms, shortness of breath.  However she had a subjective fever at the onset of symptoms.  She also had fever initially.has had sinus infections in the past and this feels similar.  She is tested at home for COVID twice, both tests were negative.  She has traveled to and from [place] over the last 3 weeks.  She has had no known exposures.  She is fully vaccinated.  Patient is not a non-smoker.  No history of asthma or COPD.  She denies malaise.  Appetite is good.  Her son is getting married in 10 days.  She will be traveling by plane  Health maintenance: She is due for screening mammogram  Osteoporosis: Due for follow-up bone density.  She continues on calcium and vitamin D supplements with regular activity.  She is currently on a drug holiday from Fosamax.  No fractures.  I reviewed the patients updated PMH, FH, and SocHx.    Patient Active Problem List   Diagnosis Date Noted  . Osteoporosis 01/03/2018  . Osteoarthritis of knees, bilateral 01/03/2018   Current Meds  Medication Sig  . calcium-vitamin D (OSCAL WITH D) 500-200 MG-UNIT tablet Take 1 tablet by mouth.  . Misc Natural Products (OSTEO BI-FLEX ADV JOINT SHIELD PO) Take 2 tablets by mouth daily.  . [DISCONTINUED] amoxicillin-clavulanate (AUGMENTIN) 875-125 MG tablet Take 1 tablet by mouth 2 (two) times daily.  . [DISCONTINUED] predniSONE (DELTASONE) 20 MG tablet Take 2 tabs  daily for 5 days    Review of Systems: Cardiovascular: negative for chest pain Respiratory: negative for SOB or persistent cough Gastrointestinal: negative for abdominal pain Genitourinary: negative for dysuria or gross hematuria  Objective  Vitals: BP 130/80   Pulse 97   Temp 98 F (36.7 C) (Temporal)   Resp 16   Ht 5\' 4"  (1.626 m)   Wt 166 lb 6.4 oz (75.5 kg)   SpO2 97%   BMI 28.56 kg/m  General: no acute distress  Psych:  Alert and oriented, normal mood and affect HEENT:  Normocephalic, atraumatic, TMs with serous effusions or retraction w/o erythema, nasal mucosa is red with purulent drainage, tender maxillary sinus present, OP mild erythematous w/o eudate, supple neck without LAD Cardiovascular:  RRR without murmur or gallop. no peripheral edema Respiratory:  Good breath sounds bilaterally, CTAB with normal respiratory effort Skin:  Warm, no rashes Neurologic:   Mental status is normal. normal gait  Assessment  1. Acute non-recurrent maxillary sinusitis   2. Age-related osteoporosis without current pathological fracture   3. Encounter for screening mammogram for breast cancer   4. Asymptomatic menopausal state      Plan    Sinusitis: History and exam is most consistent with bacterial sinus infection.  Etiology and prognosis discussed with patient.  Recommend antibiotics as ordered below.  Patient to complete course of antibiotics, use supportive medications like mucolytics and decongestants as needed.  May use  Tylenol or Advil if needed.  Symptoms should improve over the next 2 weeks.  Patient will return or call if symptoms persist or worsen.  Osteoporosis: Ordered bone density.  Patient to schedule  Follow up: No follow-ups on file.    Commons side effects, risks, benefits, and alternatives for medications and treatment plan prescribed today were discussed, and the patient expressed understanding of the given instructions. Patient is instructed to call or message  via MyChart if he/she has any questions or concerns regarding our treatment plan. No barriers to understanding were identified. We discussed Red Flag symptoms and signs in detail. Patient expressed understanding regarding what to do in case of urgent or emergency type symptoms.   Medication list was reconciled, printed and provided to the patient in AVS. Patient instructions and summary information was reviewed with the patient as documented in the AVS. This note was prepared with assistance of Dragon voice recognition software. Occasional wrong-word or sound-a-like substitutions may have occurred due to the inherent limitations of voice recognition software  Orders Placed This Encounter  Procedures  . MM DIGITAL SCREENING BILATERAL  . DG Bone Density   Meds ordered this encounter  Medications  . DISCONTD: amoxicillin-clavulanate (AUGMENTIN) 875-125 MG tablet    Sig: Take 1 tablet by mouth 2 (two) times daily.    Dispense:  14 tablet    Refill:  0  . DISCONTD: predniSONE (DELTASONE) 20 MG tablet    Sig: Take 2 tabs daily for 5 days    Dispense:  10 tablet    Refill:  0

## 2020-05-25 NOTE — Patient Instructions (Signed)
Please return for your annual complete physical; please come fasting.  Use OTC mucinex DM for cough and drainage.  Complete the antibiotics and prednisone as directed.   I have ordered a mammogram and/or bone density for you as we discussed today: [x]   Mammogram  [x]   Bone Density  Please call the office checked below to schedule your appointment:  [x]   [facility]      [address] [place],        [address]         []   [facility]  [address] [place],  [facility]   If you have any questions or concerns, please don't hesitate to send me a message via MyChart or call the office at [phone]. Thank you for visiting with [place] today! It's our pleasure caring for you.

## 2020-05-27 ENCOUNTER — Other Ambulatory Visit: Payer: Self-pay | Admitting: Family Medicine

## 2020-05-27 DIAGNOSIS — Z780 Asymptomatic menopausal state: Secondary | ICD-10-CM

## 2020-05-27 DIAGNOSIS — M810 Age-related osteoporosis without current pathological fracture: Secondary | ICD-10-CM

## 2020-05-30 ENCOUNTER — Telehealth: Payer: Self-pay

## 2020-05-30 ENCOUNTER — Other Ambulatory Visit: Payer: Self-pay

## 2020-05-30 MED ORDER — AMOXICILLIN-POT CLAVULANATE 875-125 MG PO TABS: 1.0000 | ORAL_TABLET | Freq: Two times a day (BID) | ORAL | 0 refills | Status: DC

## 2020-05-30 NOTE — Telephone Encounter (Signed)
Good Morning,  Could you please prescribe something else in place of Prednisone. I had a similar situation in [place] where I walked in to after hours clinic after work because my Doctor was closed.  They prescribed the Prednisone.  At my yearly physical my Doctor reviewed my chart and said that under no circumstance should I take Prednisone for short or long term.  I am not feeling better still have pain in my left outer ear and stuffed up.  I have been taking the Mucinex Dm and amoxicillin-clav.  I would appreciate a quick response I only have 2 days left on the amoxicillin- clav .  Do I need a refill? I will be be on a plane to [place] Thursday morning at 5:30 a.m.  Thank you

## 2020-05-30 NOTE — Telephone Encounter (Signed)
Left voicemail for patient to return call.

## 2020-05-30 NOTE — Addendum Note (Signed)
Addended by: Asencion Partridge on: 05/30/2020 10:32 AM   Modules accepted: Orders

## 2020-05-30 NOTE — Telephone Encounter (Signed)
Please advise 

## 2020-05-30 NOTE — Telephone Encounter (Signed)
Please call patient.  We can refill the  augmentin for another 7 days if needed since she still has symptoms. (you can refill if she'd like)  Unfortunately, there is no replacement for the prednisone.  She can consider using the flonase steroid nasal spray if she is comfortable. (otc) Again, I disagree with her prior doctor on short term use of the prednisone if the reason is due to  her osteoporosis. But it is not necessary to recover from the sinus infection.   She can add an otc decongestant like sudafed for the ear pressure.  Are the sinuses improved, ie less facial pain, less postnasal drainage, less cough??  Thanks.

## 2020-05-30 NOTE — Telephone Encounter (Signed)
Patient returned call and I read entire message from Dr. Mardelle Matte to her and she verbalized an understanding-I sent in the Augmentin to [facility] in [place]

## 2020-07-20 ENCOUNTER — Other Ambulatory Visit: Payer: Self-pay

## 2020-07-20 ENCOUNTER — Ambulatory Visit
Admission: RE | Admit: 2020-07-20 | Discharge: 2020-07-20 | Disposition: A | Discharge status: Home / Self Care | Payer: 59 | Source: Ambulatory Visit | Attending: Family Medicine | Admitting: Family Medicine

## 2020-07-20 DIAGNOSIS — Z1231 Encounter for screening mammogram for malignant neoplasm of breast: Secondary | ICD-10-CM

## 2020-08-25 ENCOUNTER — Encounter: Payer: 59 | Admitting: Family Medicine

## 2020-09-28 ENCOUNTER — Ambulatory Visit (INDEPENDENT_AMBULATORY_CARE_PROVIDER_SITE_OTHER): Payer: 59 | Admitting: Family Medicine

## 2020-09-28 ENCOUNTER — Other Ambulatory Visit: Payer: Self-pay

## 2020-09-28 ENCOUNTER — Encounter: Payer: Self-pay | Admitting: Family Medicine

## 2020-09-28 ENCOUNTER — Other Ambulatory Visit (HOSPITAL_COMMUNITY)
Admission: RE | Admit: 2020-09-28 | Discharge: 2020-09-28 | Disposition: A | Discharge status: Home / Self Care | Payer: 59 | Source: Ambulatory Visit | Attending: Family Medicine | Admitting: Family Medicine

## 2020-09-28 VITALS — BP 126/90 | HR 77 | Temp 97.9°F | Ht 64.0 in | Wt 169.6 lb

## 2020-09-28 DIAGNOSIS — M810 Age-related osteoporosis without current pathological fracture: Secondary | ICD-10-CM | POA: Diagnosis not present

## 2020-09-28 DIAGNOSIS — Z124 Encounter for screening for malignant neoplasm of cervix: Secondary | ICD-10-CM | POA: Insufficient documentation

## 2020-09-28 DIAGNOSIS — M545 Low back pain, unspecified: Secondary | ICD-10-CM | POA: Diagnosis not present

## 2020-09-28 DIAGNOSIS — Z1211 Encounter for screening for malignant neoplasm of colon: Secondary | ICD-10-CM | POA: Diagnosis not present

## 2020-09-28 DIAGNOSIS — R010 Benign and innocent cardiac murmurs: Secondary | ICD-10-CM

## 2020-09-28 DIAGNOSIS — L821 Other seborrheic keratosis: Secondary | ICD-10-CM

## 2020-09-28 DIAGNOSIS — Z0000 Encounter for general adult medical examination without abnormal findings: Secondary | ICD-10-CM | POA: Diagnosis present

## 2020-09-28 DIAGNOSIS — R030 Elevated blood-pressure reading, without diagnosis of hypertension: Secondary | ICD-10-CM

## 2020-09-28 DIAGNOSIS — F430 Acute stress reaction: Secondary | ICD-10-CM | POA: Diagnosis not present

## 2020-09-28 DIAGNOSIS — Z1212 Encounter for screening for malignant neoplasm of rectum: Secondary | ICD-10-CM | POA: Diagnosis not present

## 2020-09-28 DIAGNOSIS — Z0001 Encounter for general adult medical examination with abnormal findings: Secondary | ICD-10-CM

## 2020-09-28 LAB — CBC WITH DIFFERENTIAL/PLATELET
Basophils Absolute: 0 10*3/uL (ref 0.0–0.1)
Basophils Relative: 0.5 % (ref 0.0–3.0)
Eosinophils Absolute: 0.1 10*3/uL (ref 0.0–0.7)
Eosinophils Relative: 0.8 % (ref 0.0–5.0)
HCT: 42.9 % (ref 36.0–46.0)
Hemoglobin: 14.3 g/dL (ref 12.0–15.0)
Lymphocytes Relative: 26.2 % (ref 12.0–46.0)
Lymphs Abs: 1.9 10*3/uL (ref 0.7–4.0)
MCHC: 33.4 g/dL (ref 30.0–36.0)
MCV: 88.1 fl (ref 78.0–100.0)
Monocytes Absolute: 0.5 10*3/uL (ref 0.1–1.0)
Monocytes Relative: 6.4 % (ref 3.0–12.0)
Neutro Abs: 4.7 10*3/uL (ref 1.4–7.7)
Neutrophils Relative %: 66.1 % (ref 43.0–77.0)
Platelets: 244 10*3/uL (ref 150.0–400.0)
RBC: 4.87 Mil/uL (ref 3.87–5.11)
RDW: 14.2 % (ref 11.5–15.5)
WBC: 7.1 10*3/uL (ref 4.0–10.5)

## 2020-09-28 LAB — COMPREHENSIVE METABOLIC PANEL
ALT: 18 U/L (ref 0–35)
AST: 25 U/L (ref 0–37)
Albumin: 4.1 g/dL (ref 3.5–5.2)
Alkaline Phosphatase: 62 U/L (ref 39–117)
BUN: 21 mg/dL (ref 6–23)
CO2: 25 mEq/L (ref 19–32)
Calcium: 9.2 mg/dL (ref 8.4–10.5)
Chloride: 104 mEq/L (ref 96–112)
Creatinine, Ser: 0.85 mg/dL (ref 0.40–1.20)
GFR: 73.78 mL/min (ref >60.00)
Glucose, Bld: 88 mg/dL (ref 70–99)
Potassium: 4.5 mEq/L (ref 3.5–5.1)
Sodium: 139 mEq/L (ref 135–145)
Total Bilirubin: 0.9 mg/dL (ref 0.2–1.2)
Total Protein: 7 g/dL (ref 6.0–8.3)

## 2020-09-28 LAB — LIPID PANEL
Cholesterol: 176 mg/dL (ref 0–200)
HDL: 42.2 mg/dL (ref >39.00)
LDL Cholesterol: 116 mg/dL — ABNORMAL HIGH (ref 0–99)
NonHDL: 133.71
Total CHOL/HDL Ratio: 4
Triglycerides: 88 mg/dL (ref 0.0–149.0)
VLDL: 17.6 mg/dL (ref 0.0–40.0)

## 2020-09-28 LAB — POCT URINALYSIS DIPSTICK
Appearance: NORMAL
Bilirubin, UA: NEGATIVE
Blood, UA: NEGATIVE
Glucose, UA: NEGATIVE
Ketones, UA: NEGATIVE
Leukocytes, UA: NEGATIVE
Nitrite, UA: NEGATIVE
Protein, UA: NEGATIVE
Spec Grav, UA: >=1.03 — AB (ref 1.010–1.025)
Urobilinogen, UA: 0.2 E.U./dL
pH, UA: 5 (ref 5.0–8.0)

## 2020-09-28 LAB — VITAMIN D 25 HYDROXY (VIT D DEFICIENCY, FRACTURES): VITD: 83.28 ng/mL (ref 30.00–100.00)

## 2020-09-28 LAB — TSH: TSH: 2.8 u[IU]/mL (ref 0.35–5.50)

## 2020-09-28 NOTE — Progress Notes (Signed)
Subjective  Chief Complaint  Patient presents with   Annual Exam    Fasting   Osteoporosis    HPI: Madison Ray is a 60 y.o. female who presents to [facility] [place] today for a Female Wellness Visit. She also has the concerns and/or needs as listed above in the chief complaint. These will be addressed in addition to the Health Maintenance Visit.   Wellness Visit: annual visit with health maintenance review and exam with Pap  HM: pap today. Mammogram up todate. Due CRC screen; had normal colonoscopy 10 years ago. No FH of colon cancer. Inquires about cologuard. Defers flu vaccin till October. Will get at pharmacy. Other imms are up to date.  Chronic disease f/u and/or acute problem visit: (deemed necessary to be done in addition to the wellness visit): Osteoporosis: on drug holiday from fosamax after 3 years of treatment. Dexa scheduled for October. No fractures. Tolerated fosamax well.  H/o heart murmur w/o sxs: had normal stress testing. Prior pcp used to do annual or biannual ekgs reportedly.  Back pain: reports intermittent low back pain; related to stressors. W/o radicular sxs.  C/o stress: more emotional than usual. Taking care of elderly father, daughter who is struggling with marriage, young child and travel due to job, and her best friend was just diagnosed with vulvar cancer. Denies depression or panic sxs. Sleep is poor. Feels she is coping overall well. No h/o mood disorders. But knows "her plate is full" BP: mildly elevated today. But stressed. Reviewed prior readings. No h/o HTN.  Ankle pain: saw urgent care: ? Bee sting or insect bite. Improving.  Spot on upper left abdomen: wants it checked. No bleeding. No h/o skin cancer.   Assessment  1. Annual physical exam   2. Cervical cancer screening   3. Age-related osteoporosis without current pathological fracture   4. Screening for colorectal cancer   5. Acute bilateral low back pain without sciatica    6. Stress reaction   7. Prehypertension   8. Benign heart murmur   9. Seborrheic keratosis      Plan  Female Wellness Visit: Age appropriate Health Maintenance and Prevention measures were discussed with patient. Included topics are cancer screening recommendations, ways to keep healthy (see AVS) including dietary and exercise recommendations, regular eye and dental care, use of seat belts, and avoidance of moderate alcohol use and tobacco use. Cologuard ordered after counseling and education given. Mammo nl. Pap today with Hr HPV SCREEN.  BMI: discussed patient's BMI and encouraged positive lifestyle modifications to help get to or maintain a target BMI. HM needs and immunizations were addressed and ordered. See below for orders. See HM and immunization section for updates. FLU ELIGIBLE Routine labs and screening tests ordered including cmp, cbc and lipids where appropriate. Discussed recommendations regarding Vit D and calcium supplementation (see AVS)  Chronic disease management visit and/or acute problem visit: Osteoporosis: dexa in October. If worsening will restart fosamax. Education given today. Weight bearing activity recommended.  Prehypertension: low salt diet and weight loss. Will start monitoring at home. Elevated today in part stress and pain related.  Seb k and heart murmur; both benign. Reassured.  Back pain: msk w/o red flag sxs. Start back exercises. See ho  Stress reaction: counseling done. No indication for intervention at this time. Discussed worsenign sxs and options of treatment including benzo, ssri and/or therapy. Pt to return if worsens.   Follow up: 75mo to recheck bp and mood  Orders Placed This Encounter  Procedures   CBC with Differential/Platelet   Comprehensive metabolic panel   Lipid panel   TSH   VITAMIN D 25 Hydroxy (Vit-D Deficiency, Fractures)   Cologuard   POCT urinalysis dipstick   No orders of the defined types were placed in this encounter.      Body mass index is 29.11 kg/m. Wt Readings from Last 3 Encounters:  09/28/20 169 lb 9.6 oz (76.9 kg)  05/25/20 166 lb 6.4 oz (75.5 kg)  07/03/19 167 lb 6.4 oz (75.9 kg)     Patient Active Problem List   Diagnosis Date Noted   Prehypertension 09/28/2020   Benign heart murmur 09/28/2020   Osteoporosis 01/03/2018    DEXA 2014 t = -2.6 lowest; started fosamax and imprved  DEXA 2016 t = -2.2 lowest, improved on fosamax  DEXA 02/2018 T = - 1.1 lowest, on fosamax. May consider a drug holiday. To discuss with pt    Osteoarthritis of knees, bilateral 01/03/2018    Clinical diagnosis    Health Maintenance  Topic Date Due   Fecal DNA (Cologuard)  Never done   DEXA SCAN  03/03/2020   INFLUENZA VACCINE  08/22/2020   PAP SMEAR-Modifier  11/14/2020   MAMMOGRAM  07/20/2021   TETANUS/TDAP  01/04/2028   COVID-19 Vaccine  Completed   Hepatitis C Screening  Completed   HIV Screening  Completed   Zoster Vaccines- Shingrix  Completed   Pneumococcal Vaccine 66-81 Years old  Aged Out   HPV VACCINES  Aged Out   Immunization History  Administered Date(s) Administered   Influenza, Quadrivalent, Recombinant, Inj, Pf 10/28/2017   Influenza-Unspecified 09/26/2018, 10/27/2019   PFIZER(Purple Top)SARS-COV-2 Vaccination 03/21/2019, 04/13/2019, 11/24/2019, 05/03/2020   Tdap 01/03/2018   Zoster Recombinat (Shingrix) 11/08/2016, 01/08/2017   We updated and reviewed the patient's past history in detail and it is documented below. Allergies: Patient has No Known Allergies. Past Medical History Patient  has a past medical history of Arthritis, Osteoarthritis of knees, bilateral (01/03/2018), and Osteoporosis. Past Surgical History Patient  has no past surgical history on file. Family History: Patient family history includes Arthritis in her mother and sister; COPD in her mother; Heart attack in her father; Hyperlipidemia in her father and mother; Hypertension in her father and mother; Hyperthyroidism  in her daughter; Hypothyroidism in her mother; Stroke in her mother. Social History:  Patient  reports that she has never smoked. She has never used smokeless tobacco. She reports that she does not currently use alcohol. She reports that she does not use drugs.  Review of Systems: Constitutional: negative for fever or malaise Ophthalmic: negative for photophobia, double vision or loss of vision Cardiovascular: negative for chest pain, dyspnea on exertion, or new LE swelling Respiratory: negative for SOB or persistent cough Gastrointestinal: negative for abdominal pain, change in bowel habits or melena Genitourinary: negative for dysuria or gross hematuria, no abnormal uterine bleeding or disharge Musculoskeletal: negative for new gait disturbance or muscular weakness Integumentary: negative for new or persistent rashes, no breast lumps Neurological: negative for TIA or stroke symptoms Psychiatric: negative for SI or delusions Allergic/Immunologic: negative for hives  Patient Care Team    Relationship Specialty Notifications Start End  Willow Ora, MD PCP - General Family Medicine  01/03/18     Objective  Vitals: BP 126/90   Pulse 77   Temp 97.9 F (36.6 C)   Ht 5\' 4"  (1.626 m)   Wt 169 lb 9.6 oz (76.9 kg)  SpO2 98%   BMI 29.11 kg/m  General:  Well developed, well nourished, no acute distress  Psych:  Alert and orientedx3,normal mood and affect HEENT:  Normocephalic, atraumatic, non-icteric sclera,  supple neck without adenopathy, mass or thyromegaly Cardiovascular:  Normal S1, S2, RRR without gallop, rub or murmur Respiratory:  Good breath sounds bilaterally, CTAB with normal respiratory effort Gastrointestinal: normal bowel sounds, soft, non-tender, no noted masses. No HSM MSK: no deformities, contusions. Joints are without erythema or swelling. Right ankle is swollen Skin:  Warm, no rashes or suspicious lesions noted, small seb k on right upper abdomen. Multiple sun  related skin changes on upper back and arms Neurologic:    Mental status is normal. CN 2-11 are normal. Gross motor and sensory exams are normal. Normal gait. No tremor Breast Exam: No mass, skin retraction or nipple discharge is appreciated in either breast. No axillary adenopathy. Fibrocystic changes are not noted Pelvic Exam: Normal external genitalia, no vulvar or vaginal lesions present. Atrophic vaginal mucosa present Clear cervix w/o CMT. Bimanual exam reveals a nontender fundus w/o masses, nl size. No adnexal masses present. No inguinal adenopathy. A PAP smear was performed.   Commons side effects, risks, benefits, and alternatives for medications and treatment plan prescribed today were discussed, and the patient expressed understanding of the given instructions. Patient is instructed to call or message via MyChart if he/she has any questions or concerns regarding our treatment plan. No barriers to understanding were identified. We discussed Red Flag symptoms and signs in detail. Patient expressed understanding regarding what to do in case of urgent or emergency type symptoms.  Medication list was reconciled, printed and provided to the patient in AVS. Patient instructions and summary information was reviewed with the patient as documented in the AVS. This note was prepared with assistance of Dragon voice recognition software. Occasional wrong-word or sound-a-like substitutions may have occurred due to the inherent limitations of voice recognition software  This visit occurred during the SARS-CoV-2 public health emergency.  Safety protocols were in place, including screening questions prior to the visit, additional usage of staff PPE, and extensive cleaning of exam room while observing appropriate contact time as indicated for disinfecting solutions.

## 2020-09-28 NOTE — Patient Instructions (Signed)
Please return in 6 months to recheck blood pressures, stress and mood.   I will release your lab results to you on your MyChart account with further instructions. Please reply with any questions.    If you have any questions or concerns, please don't hesitate to send me a message via MyChart or call the office at [phone]. Thank you for visiting with [place] today! It's our pleasure caring for you.   I recommend the Cologuard test for your colon cancer screening that is due. I have ordered this test for you. [facility] will soon contact you to verify your insurance, address etc. They will then send you the kit; follow the instructions in the kit and return the kit to Cologuard. They will run the test and send the results to me. I will then give you the results. If this test is negative, we recommend repeating a colon cancer screening test in 3 years. If it is positive, I will refer you to a Gastroenterologist so you can get set up for the recommended colonoscopy.  Thank you!   Back Exercises The following exercises strengthen the muscles that help to support the trunk (torso) and back. They also help to keep the lower back flexible. Doing these exercises can help to prevent or lessen existing low back pain. If you have back pain or discomfort, try doing these exercises 2-3 times each day or as told by your health care provider. As your pain improves, do them once each day, but increase the number of times that you repeat the steps for each exercise (do more repetitions). To prevent the recurrence of back pain, continue to do these exercises once each day or as told by your health care provider. Do exercises exactly as told by your health care provider and adjust them as directed. It is normal to feel mild stretching, pulling, tightness, or discomfort as you do these exercises, but you should stop right away if you feel sudden pain or your pain gets worse. Exercises Single knee to  chest Repeat these steps 3-5 times for each leg: Lie on your back on a firm bed or the floor with your legs extended. Bring one knee to your chest. Your other leg should stay extended and in contact with the floor. Hold your knee in place by grabbing your knee or thigh with both hands and hold. Pull on your knee until you feel a gentle stretch in your lower back or buttocks. Hold the stretch for 10-30 seconds. Slowly release and straighten your leg. Pelvic tilt Repeat these steps 5-10 times: Lie on your back on a firm bed or the floor with your legs extended. Bend your knees so they are pointing toward the ceiling and your feet are flat on the floor. Tighten your lower abdominal muscles to press your lower back against the floor. This motion will tilt your pelvis so your tailbone points up toward the ceiling instead of pointing to your feet or the floor. With gentle tension and even breathing, hold this position for 5-10 seconds. Cat-cow Repeat these steps until your lower back becomes more flexible: Get into a hands-and-knees position on a firm bed or the floor. Keep your hands under your shoulders, and keep your knees under your hips. You may place padding under your knees for comfort. Let your head hang down toward your chest. Contract your abdominal muscles and point your tailbone toward the floor so your lower back becomes rounded like the back of a cat.  Hold this position for 5 seconds. Slowly lift your head, let your abdominal muscles relax, and point your tailbone up toward the ceiling so your back forms a sagging arch like the back of a cow. Hold this position for 5 seconds.  Press-ups Repeat these steps 5-10 times: Lie on your abdomen (face-down) on a firm bed or the floor. Place your palms near your head, about shoulder-width apart. Keeping your back as relaxed as possible and keeping your hips on the floor, slowly straighten your arms to raise the top half of your body and lift  your shoulders. Do not use your back muscles to raise your upper torso. You may adjust the placement of your hands to make yourself more comfortable. Hold this position for 5 seconds while you keep your back relaxed. Slowly return to lying flat on the floor.  Bridges Repeat these steps 10 times: Lie on your back on a firm bed or the floor. Bend your knees so they are pointing toward the ceiling and your feet are flat on the floor. Your arms should be flat at your sides, next to your body. Tighten your buttocks muscles and lift your buttocks off the floor until your waist is at almost the same height as your knees. You should feel the muscles working in your buttocks and the back of your thighs. If you do not feel these muscles, slide your feet 1-2 inches (2.5-5 cm) farther away from your buttocks. Hold this position for 3-5 seconds. Slowly lower your hips to the starting position, and allow your buttocks muscles to relax completely. If this exercise is too easy, try doing it with your arms crossed over your chest. Abdominal crunches Repeat these steps 5-10 times: Lie on your back on a firm bed or the floor with your legs extended. Bend your knees so they are pointing toward the ceiling and your feet are flat on the floor. Cross your arms over your chest. Tip your chin slightly toward your chest without bending your neck. Tighten your abdominal muscles and slowly raise your torso high enough to lift your shoulder blades a tiny bit off the floor. Avoid raising your torso higher than that because it can put too much stress on your lower back and does not help to strengthen your abdominal muscles. Slowly return to your starting position. Back lifts Repeat these steps 5-10 times: Lie on your abdomen (face-down) with your arms at your sides, and rest your forehead on the floor. Tighten the muscles in your legs and your buttocks. Slowly lift your chest off the floor while you keep your hips pressed  to the floor. Keep the back of your head in line with the curve in your back. Your eyes should be looking at the floor. Hold this position for 3-5 seconds. Slowly return to your starting position. Contact a health care provider if: Your back pain or discomfort gets much worse when you do an exercise. Your worsening back pain or discomfort does not lessen within 2 hours after you exercise. If you have any of these problems, stop doing these exercises right away. Do not do them again unless your health care provider says that you can. Get help right away if: You develop sudden, severe back pain. If this happens, stop doing the exercises right away. Do not do them again unless your health care provider says that you can. This information is not intended to replace advice given to you by your health care provider. Make sure you discuss  any questions you have with your health care provider. Document Revised: 03/23/2020 Document Reviewed: 03/23/2020 Elsevier Patient Education  [employer].

## 2020-10-06 LAB — CYTOLOGY - PAP
Comment: NEGATIVE
Diagnosis: NEGATIVE
High risk HPV: NEGATIVE

## 2020-10-27 ENCOUNTER — Encounter: Payer: Self-pay | Admitting: Family Medicine

## 2020-10-27 NOTE — Telephone Encounter (Signed)
Spoke with Madison Ray at [facility], states that they do not have the order in their system. Will refax the order and inform the patient

## 2020-11-07 ENCOUNTER — Other Ambulatory Visit: Payer: Self-pay

## 2020-11-07 ENCOUNTER — Ambulatory Visit
Admission: RE | Admit: 2020-11-07 | Discharge: 2020-11-07 | Disposition: A | Discharge status: Home / Self Care | Payer: 59 | Source: Ambulatory Visit | Attending: Family Medicine | Admitting: Family Medicine

## 2020-11-07 DIAGNOSIS — M810 Age-related osteoporosis without current pathological fracture: Secondary | ICD-10-CM

## 2020-11-07 DIAGNOSIS — Z780 Asymptomatic menopausal state: Secondary | ICD-10-CM

## 2020-11-14 NOTE — Telephone Encounter (Signed)
Spoke with patient regarding cologuard order, let her know that they have received everything they need and will get her kit sent out within 7 days. Gave a verbal understanding

## 2020-11-28 LAB — COLOGUARD: Cologuard: NEGATIVE

## 2020-12-02 ENCOUNTER — Encounter: Payer: Self-pay | Admitting: Family Medicine

## 2020-12-02 LAB — COLOGUARD: COLOGUARD: NEGATIVE

## 2020-12-06 ENCOUNTER — Encounter: Payer: Self-pay | Admitting: Family Medicine

## 2021-03-28 ENCOUNTER — Telehealth: Payer: Self-pay | Admitting: Family Medicine

## 2021-03-28 ENCOUNTER — Encounter: Payer: Self-pay | Admitting: Family Medicine

## 2021-03-28 ENCOUNTER — Ambulatory Visit (INDEPENDENT_AMBULATORY_CARE_PROVIDER_SITE_OTHER): Payer: 59 | Admitting: Family Medicine

## 2021-03-28 ENCOUNTER — Other Ambulatory Visit: Payer: Self-pay

## 2021-03-28 VITALS — BP 126/87 | HR 67 | Temp 97.9°F | Ht 64.0 in | Wt 170.0 lb

## 2021-03-28 DIAGNOSIS — H9192 Unspecified hearing loss, left ear: Secondary | ICD-10-CM

## 2021-03-28 DIAGNOSIS — F4321 Adjustment disorder with depressed mood: Secondary | ICD-10-CM

## 2021-03-28 DIAGNOSIS — R030 Elevated blood-pressure reading, without diagnosis of hypertension: Secondary | ICD-10-CM | POA: Diagnosis not present

## 2021-03-28 NOTE — Patient Instructions (Signed)
Please return in 6 months for your annual complete physical; please come fasting.  ? ?Good seeing you. Have a wonderful spring.  ? ?If you have any questions or concerns, please don't hesitate to send me a message via MyChart or call the office at [phone]. Thank you for visiting with [place] today! It's our pleasure caring for you.  ?

## 2021-03-28 NOTE — Telephone Encounter (Signed)
Left message to return call to our office at their convenience. Need an OV for referral  ?

## 2021-03-28 NOTE — Telephone Encounter (Signed)
..  Reason for Referral Request:  Disturbed Hearing, left ? ?Has Patient been seen by PCP for this complaint? yes ? ?No, Please schedule patient for appointment for complaint. ? ?Yes, Please find out following information: ? ?Reason: Disturbed Hearing ? ?Referral to which Specialty: Audiologist ? ?Preferred office/ provider:   ?

## 2021-03-28 NOTE — Progress Notes (Signed)
? ? ?Subjective  ?CC:  ?Chief Complaint  ?Patient presents with  ? Hypertension  ?  Last BP: 126/90  ? Stress  ?  Pt says that when she was here last her Best friend passed away from cancer, and her sister was sick. This contributed to a lot of stress. She says that she is feeling a lot better. She is exercising more-Pickle ball and Line dancing.   ? Mood  ? ? ?HPI: Madison Ray is a 61 y.o. female who presents to the office today to address the problems listed above in the chief complaint. ?Prehypertension: Blood pressure remains mildly elevated, diastolics.  Feels well.  No chest pain or shortness of breath.  She is working on diet, weight loss and increasing exercise.  There is a family history of high blood pressure. ?Mood follow-up: Unfortunately, her best friend passed away last month.  She is struggled but has grieved appropriately.  Her family is reunited and she is now seeing her grandchild again.  Overall her mood is stable.  No signs or symptoms of depression ?She did have an upper respiratory infection back in December.  Felt her left eardrum pop.  Lost hearing for about a month.  Now feeling much better.  Would like it checked. ? ?BP Readings from Last 3 Encounters:  ?03/28/21 126/87  ?09/28/20 126/90  ?05/25/20 130/80  ? ?Wt Readings from Last 3 Encounters:  ?03/28/21 170 lb (77.1 kg)  ?09/28/20 169 lb 9.6 oz (76.9 kg)  ?05/25/20 166 lb 6.4 oz (75.5 kg)  ? ? ? ?Assessment  ?1. Prehypertension   ?2. Grief   ?3. Disturbed hearing, left   ? ?  ?Plan  ?Prehypertension: Discussed low-sodium diet, weight loss, exercise and recheck in 6 months. ?Grief: Counseling done.  She is coping well.  This is a big loss for her.  No signs or symptoms of depression. ?Insert hearing: Hearing screen done.  No obvious tympanic perforation present today. ? ?Follow up: 6 months for complete physical ?Visit date not found ? ? ?I reviewed the patients updated PMH, FH, and SocHx.  ?  ?Patient Active Problem List  ?  Diagnosis Date Noted  ? Prehypertension 09/28/2020  ? Benign heart murmur 09/28/2020  ? Osteoporosis 01/03/2018  ? Osteoarthritis of knees, bilateral 01/03/2018  ? ?No outpatient medications have been marked as taking for the 03/28/21 encounter (Office Visit) with Willow Ora, MD.  ? ? ?Allergies: ?Patient has No Known Allergies. ?Family History: ?Patient family history includes Arthritis in her mother and sister; COPD in her mother; Heart attack in her father; Hyperlipidemia in her father and mother; Hypertension in her father and mother; Hyperthyroidism in her daughter; Hypothyroidism in her mother; Stroke in her mother. ?Social History:  ?Patient  reports that she has never smoked. She has never used smokeless tobacco. She reports that she does not currently use alcohol. She reports that she does not use drugs. ? ?Review of Systems: ?Constitutional: Negative for fever malaise or anorexia ?Cardiovascular: negative for chest pain ?Respiratory: negative for SOB or persistent cough ?Gastrointestinal: negative for abdominal pain ? ?Objective  ?Vitals: BP 126/87   Pulse 67   Temp 97.9 ?F (36.6 ?C) (Temporal)   Ht 5\' 4"  (1.626 m)   Wt 170 lb (77.1 kg)   SpO2 99%   BMI 29.18 kg/m?  ?General: no acute distress , A&Ox3, normal mood ?HEENT: PEERL, conjunctiva normal, neck is supple, bilateral Tms nl appearing, left is partially obstructing  ?Cardiovascular:  RRR  without murmur or gallop.  ?Respiratory:  Good breath sounds bilaterally, CTAB with normal respiratory effort ?Skin:  Warm, no rashes ? ? ? ?Commons side effects, risks, benefits, and alternatives for medications and treatment plan prescribed today were discussed, and the patient expressed understanding of the given instructions. Patient is instructed to call or message via MyChart if he/she has any questions or concerns regarding our treatment plan. No barriers to understanding were identified. We discussed Red Flag symptoms and signs in detail. Patient  expressed understanding regarding what to do in case of urgent or emergency type symptoms.  ?Medication list was reconciled, printed and provided to the patient in AVS. Patient instructions and summary information was reviewed with the patient as documented in the AVS. ?This note was prepared with assistance of Conservation officer, historic buildings. Occasional wrong-word or sound-a-like substitutions may have occurred due to the inherent limitations of voice recognition software ? ?This visit occurred during the SARS-CoV-2 public health emergency.  Safety protocols were in place, including screening questions prior to the visit, additional usage of staff PPE, and extensive cleaning of exam room while observing appropriate contact time as indicated for disinfecting solutions.  ? ?

## 2021-03-29 NOTE — Telephone Encounter (Signed)
Pt was seen in the office for complaint, and a referral has been placed. ? ?Thanks! ?

## 2021-04-18 ENCOUNTER — Ambulatory Visit: Payer: 59 | Attending: Family Medicine | Admitting: Audiologist

## 2021-04-18 ENCOUNTER — Other Ambulatory Visit: Payer: Self-pay

## 2021-04-18 DIAGNOSIS — H903 Sensorineural hearing loss, bilateral: Secondary | ICD-10-CM | POA: Diagnosis present

## 2021-04-18 NOTE — Procedures (Signed)
?  [facility] ?[address] ?[place], [place]  [address] ?[phone] ? ?AUDIOLOGICAL  EVALUATION ? ?NAME: Madison Ray     ?DOB:   1960-04-12      ?MRN: [id number]                                                                                     ?DATE: 04/18/2021     ?REFERENT: Willow Ora, MD ?STATUS: Outpatient ?DIAGNOSIS: Mild Sensorineural Hearing Loss  ? ? ?History: ?Jalana was seen for an audiological evaluation.  ?[name] is receiving a hearing evaluation due to concerns for hearing in her left ear after her left eardrums ruptured. Last December Ethelle was very ill with the flu and congestion. Her left eardrum ruptured.  Since then she feels her ear has healed and she is hearing better on the left side. She was worried when her left ear did not pop when she last flew but her right ear did. No pain or pressure reported in either ear. No ringing or buzzing in either ear. [name] has a history of swimmers ear and ear infections as a child.  ?Medical history negative for a condition which is a risk factor for hearing loss. No other relevant case history reported.  ? ? ?Evaluation:  ?Otoscopy showed a clear view of the right tympanic membranes, partial view of left membrane showed healthy cone of light but view was obstructed by some dried skin and blood. ?Tympanometry results were consistent with normal middle ear pressure, bilaterally   ?Audiometric testing was completed using conventional audiometry with insert and supraural transducer. Speech Recognition Thresholds were consistent with pure tone averages. Word Recognition was excellent at conversation level. Pure tone thresholds show normal sloping to mild sensorineural hearing loss in both ears at [facility] and 8k Hz only. Test results are consistent with mild high frequency hearing loss with excellent ability to understand speech.  ? ?Results:  ?The test results were reviewed with [name]. Her left ear has healed  well. The eardrum shows normal mobility with no sign of a hearing loss caused by the rupture. She can fly without worry. She has a very mild hearing loss. Recommend she just start monitoring her hearing every two years.  ? ?Recommendations: ?1.   No further audiologic testing is needed unless future hearing concerns arise. Start monitoring hearing every other year after age 26.  ?  ?[name]  ?Audiologist, Au.D., CCC-A ?04/18/2021  9:31 AM ? ?Cc: Willow Ora, MD ? ?

## 2021-06-06 ENCOUNTER — Other Ambulatory Visit: Payer: Self-pay | Admitting: Family Medicine

## 2021-06-06 DIAGNOSIS — Z1231 Encounter for screening mammogram for malignant neoplasm of breast: Secondary | ICD-10-CM

## 2021-07-27 ENCOUNTER — Ambulatory Visit: Payer: 59

## 2021-08-03 ENCOUNTER — Ambulatory Visit (INDEPENDENT_AMBULATORY_CARE_PROVIDER_SITE_OTHER): Payer: 59

## 2021-08-03 DIAGNOSIS — Z1231 Encounter for screening mammogram for malignant neoplasm of breast: Secondary | ICD-10-CM

## 2021-09-26 ENCOUNTER — Other Ambulatory Visit (INDEPENDENT_AMBULATORY_CARE_PROVIDER_SITE_OTHER): Payer: 59

## 2021-09-26 ENCOUNTER — Encounter: Payer: Self-pay | Admitting: Family Medicine

## 2021-09-26 ENCOUNTER — Ambulatory Visit (INDEPENDENT_AMBULATORY_CARE_PROVIDER_SITE_OTHER): Payer: 59 | Admitting: Family Medicine

## 2021-09-26 VITALS — BP 120/80 | HR 56 | Temp 98.0°F | Ht 64.0 in | Wt 172.8 lb

## 2021-09-26 DIAGNOSIS — M810 Age-related osteoporosis without current pathological fracture: Secondary | ICD-10-CM | POA: Diagnosis not present

## 2021-09-26 DIAGNOSIS — Z0000 Encounter for general adult medical examination without abnormal findings: Secondary | ICD-10-CM | POA: Diagnosis not present

## 2021-09-26 DIAGNOSIS — R030 Elevated blood-pressure reading, without diagnosis of hypertension: Secondary | ICD-10-CM | POA: Diagnosis not present

## 2021-09-26 DIAGNOSIS — B351 Tinea unguium: Secondary | ICD-10-CM

## 2021-09-26 LAB — COMPREHENSIVE METABOLIC PANEL
ALT: 16 U/L (ref 0–35)
AST: 21 U/L (ref 0–37)
Albumin: 4.1 g/dL (ref 3.5–5.2)
Alkaline Phosphatase: 80 U/L (ref 39–117)
BUN: 18 mg/dL (ref 6–23)
CO2: 26 mEq/L (ref 19–32)
Calcium: 8.9 mg/dL (ref 8.4–10.5)
Chloride: 108 mEq/L (ref 96–112)
Creatinine, Ser: 0.93 mg/dL (ref 0.40–1.20)
GFR: 65.77 mL/min (ref >60.00)
Glucose, Bld: 87 mg/dL (ref 70–99)
Potassium: 4 mEq/L (ref 3.5–5.1)
Sodium: 142 mEq/L (ref 135–145)
Total Bilirubin: 1.3 mg/dL — ABNORMAL HIGH (ref 0.2–1.2)
Total Protein: 6.9 g/dL (ref 6.0–8.3)

## 2021-09-26 LAB — LIPID PANEL
Cholesterol: 191 mg/dL (ref 0–200)
HDL: 57.6 mg/dL (ref >39.00)
LDL Cholesterol: 110 mg/dL — ABNORMAL HIGH (ref 0–99)
NonHDL: 133.25
Total CHOL/HDL Ratio: 3
Triglycerides: 117 mg/dL (ref 0.0–149.0)
VLDL: 23.4 mg/dL (ref 0.0–40.0)

## 2021-09-26 LAB — CBC WITH DIFFERENTIAL/PLATELET
Basophils Absolute: 0 10*3/uL (ref 0.0–0.1)
Basophils Relative: 0.7 % (ref 0.0–3.0)
Eosinophils Absolute: 0 10*3/uL (ref 0.0–0.7)
Eosinophils Relative: 0.8 % (ref 0.0–5.0)
HCT: 41.1 % (ref 36.0–46.0)
Hemoglobin: 13.9 g/dL (ref 12.0–15.0)
Lymphocytes Relative: 41.2 % (ref 12.0–46.0)
Lymphs Abs: 2.1 10*3/uL (ref 0.7–4.0)
MCHC: 33.9 g/dL (ref 30.0–36.0)
MCV: 88.3 fl (ref 78.0–100.0)
Monocytes Absolute: 0.4 10*3/uL (ref 0.1–1.0)
Monocytes Relative: 7.6 % (ref 3.0–12.0)
Neutro Abs: 2.5 10*3/uL (ref 1.4–7.7)
Neutrophils Relative %: 49.7 % (ref 43.0–77.0)
Platelets: 238 10*3/uL (ref 150.0–400.0)
RBC: 4.66 Mil/uL (ref 3.87–5.11)
RDW: 14.5 % (ref 11.5–15.5)
WBC: 5.1 10*3/uL (ref 4.0–10.5)

## 2021-09-26 LAB — TSH: TSH: 2.87 u[IU]/mL (ref 0.35–5.50)

## 2021-09-26 MED ORDER — TERBINAFINE HCL 250 MG PO TABS: 250.0000 mg | ORAL_TABLET | Freq: Every day | ORAL | 2 refills | Status: DC

## 2021-09-26 NOTE — Progress Notes (Signed)
Subjective  Chief Complaint  Patient presents with   Annual Exam    Pt here for Annual Exam and is currently fasting     HPI: Madison Ray is a 61 y.o. female who presents to [facility] today for a Female Wellness Visit. She also has the concerns and/or needs as listed above in the chief complaint. These will be addressed in addition to the Health Maintenance Visit.   Wellness Visit: annual visit with health maintenance review and exam without Pap  HM: screens are current. Healthy lifestyle. Feeling well. Pickleball and swims regularly for exercise. Diet is improved.  ROS: positive for occasional memory/forgetfulness Will get flu and covid vaccines at pharmacy Chronic disease f/u and/or acute problem visit: (deemed necessary to be done in addition to the wellness visit): Reviewed dexa results. Osteoporosis history with most recent dexa 10/22 stable osteopenia on drug holiday from fosamax. C/o toenail fungus not improved w/ otc remedies.   Assessment  1. Annual physical exam   2. Age-related osteoporosis without current pathological fracture   3. Prehypertension   4. Onychomycosis      Plan  Female Wellness Visit: Age appropriate Health Maintenance and Prevention measures were discussed with patient. Included topics are cancer screening recommendations, ways to keep healthy (see AVS) including dietary and exercise recommendations, regular eye and dental care, use of seat belts, and avoidance of moderate alcohol use and tobacco use.  BMI: discussed patient's BMI and encouraged positive lifestyle modifications to help get to or maintain a target BMI. HM needs and immunizations were addressed and ordered. See below for orders. See HM and immunization section for updates. Routine labs and screening tests ordered including cmp, cbc and lipids where appropriate. Discussed recommendations regarding Vit D and calcium supplementation (see AVS)  Chronic  disease management visit and/or acute problem visit: Bp is normal Osteoporosis: stable.  Lamisil for toenail fungus.   Follow up: 12 mo for cpe  Orders Placed This Encounter  Procedures   CBC with Differential/Platelet   Comprehensive metabolic panel   Lipid panel   TSH   Meds ordered this encounter  Medications   terbinafine (LAMISIL) 250 MG tablet    Sig: Take 1 tablet (250 mg total) by mouth daily.    Dispense:  30 tablet    Refill:  2      Body mass index is 29.66 kg/m. Wt Readings from Last 3 Encounters:  09/26/21 172 lb 12.8 oz (78.4 kg)  03/28/21 170 lb (77.1 kg)  09/28/20 169 lb 9.6 oz (76.9 kg)     Patient Active Problem List   Diagnosis Date Noted   Prehypertension 09/28/2020   Benign heart murmur 09/28/2020   Osteoporosis 01/03/2018    DEXA 2014 t = -2.6 lowest; started fosamax and imprved  DEXA 2016 t = -2.2 lowest, improved on fosamax  DEXA 02/2018 T = - 1.1 lowest, on fosamax. May consider a drug holiday. DEXA 10/2020 T=-1.8 lowest, radius, drug holiday. Recheck 2 years.    Osteoarthritis of knees, bilateral 01/03/2018    Clinical diagnosis    Health Maintenance  Topic Date Due   COVID-19 Vaccine (5 - Pfizer risk series) 10/12/2021 (Originally 06/28/2020)   INFLUENZA VACCINE  04/22/2022 (Originally 08/22/2021)   MAMMOGRAM  08/04/2022   DEXA SCAN  11/08/2022   Fecal DNA (Cologuard)  11/29/2023   PAP SMEAR-Modifier  09/28/2025   TETANUS/TDAP  01/04/2028   Hepatitis C Screening  Completed   HIV Screening  Completed   Zoster Vaccines- Shingrix  Completed   HPV VACCINES  Aged Out   COLONOSCOPY (Pts 45-72yrs Insurance coverage will need to be confirmed)  Discontinued   Immunization History  Administered Date(s) Administered   Influenza, Quadrivalent, Recombinant, Inj, Pf 10/28/2017   Influenza-Unspecified 09/26/2018, 10/27/2019, 10/27/2020   PFIZER(Purple Top)SARS-COV-2 Vaccination 03/21/2019, 04/13/2019, 11/24/2019, 05/03/2020   Tdap 01/03/2018    Zoster Recombinat (Shingrix) 11/08/2016, 01/08/2017   We updated and reviewed the patient's past history in detail and it is documented below. Allergies: Patient has No Known Allergies. Past Medical History Patient  has a past medical history of Arthritis, Osteoarthritis of knees, bilateral (01/03/2018), and Osteoporosis. Past Surgical History Patient  has no past surgical history on file. Family History: Patient family history includes Arthritis in her mother and sister; COPD in her mother; Heart attack in her father; Hyperlipidemia in her father and mother; Hypertension in her father and mother; Hyperthyroidism in her daughter; Hypothyroidism in her mother; Stroke in her mother. Social History:  Patient  reports that she has never smoked. She has never used smokeless tobacco. She reports that she does not currently use alcohol. She reports that she does not use drugs.  Review of Systems: Constitutional: negative for fever or malaise Ophthalmic: negative for photophobia, double vision or loss of vision Cardiovascular: negative for chest pain, dyspnea on exertion, or new LE swelling Respiratory: negative for SOB or persistent cough Gastrointestinal: negative for abdominal pain, change in bowel habits or melena Genitourinary: negative for dysuria or gross hematuria, no abnormal uterine bleeding or disharge Musculoskeletal: negative for new gait disturbance or muscular weakness Integumentary: negative for new or persistent rashes, no breast lumps Neurological: negative for TIA or stroke symptoms Psychiatric: negative for SI or delusions Allergic/Immunologic: negative for hives  Patient Care Team    Relationship Specialty Notifications Start End  Willow Ora, MD PCP - General Family Medicine  01/03/18     Objective  Vitals: BP 120/80   Pulse (!) 56   Temp 98 F (36.7 C)   Ht 5\' 4"  (1.626 m)   Wt 172 lb 12.8 oz (78.4 kg)   SpO2 98%   BMI 29.66 kg/m  General:  Well developed,  well nourished, no acute distress  Psych:  Alert and orientedx3,normal mood and affect HEENT:  Normocephalic, atraumatic, non-icteric sclera,  supple neck without adenopathy, mass or thyromegaly Cardiovascular:  Normal S1, S2, RRR without gallop, rub or murmur Respiratory:  Good breath sounds bilaterally, CTAB with normal respiratory effort Gastrointestinal: normal bowel sounds, soft, non-tender, no noted masses. No HSM MSK: no deformities, contusions. Joints are without erythema or swelling. Toenails: ridging, thickening and discolored Skin:  Warm, no rashes or suspicious lesions noted   Commons side effects, risks, benefits, and alternatives for medications and treatment plan prescribed today were discussed, and the patient expressed understanding of the given instructions. Patient is instructed to call or message via MyChart if he/she has any questions or concerns regarding our treatment plan. No barriers to understanding were identified. We discussed Red Flag symptoms and signs in detail. Patient expressed understanding regarding what to do in case of urgent or emergency type symptoms.  Medication list was reconciled, printed and provided to the patient in AVS. Patient instructions and summary information was reviewed with the patient as documented in the AVS. This note was prepared with assistance of Dragon voice recognition software. Occasional wrong-word or sound-a-like substitutions may have occurred due to the inherent limitations of voice recognition software  This  visit occurred during the SARS-CoV-2 public health emergency.  Safety protocols were in place, including screening questions prior to the visit, additional usage of staff PPE, and extensive cleaning of exam room while observing appropriate contact time as indicated for disinfecting solutions.

## 2021-09-26 NOTE — Patient Instructions (Signed)
Please return in 12 months for your annual complete physical; please come fasting.   I will release your lab results to you on your MyChart account with further instructions. You may see the results before I do, but when I review them I will send you a message with my report or have my assistant call you if things need to be discussed. Please reply to my message with any questions. Thank you!   Please go to our [facility] for your lab work.  You can walk in M-F between 8:30am- noon or 1pm - 5pm.  They will send me the results, then I will let you know the results with instructions.   Address: [address].    If you have any questions or concerns, please don't hesitate to send me a message via MyChart or call the office at [phone]. Thank you for visiting with [place] today! It's our pleasure caring for you.

## 2022-02-20 ENCOUNTER — Encounter: Payer: Self-pay | Admitting: Family Medicine

## 2022-02-20 MED ORDER — TERBINAFINE HCL 250 MG PO TABS: 250.0000 mg | ORAL_TABLET | Freq: Every day | ORAL | 2 refills | Status: DC

## 2022-03-09 ENCOUNTER — Encounter: Payer: Self-pay | Admitting: Family Medicine

## 2022-05-24 ENCOUNTER — Other Ambulatory Visit: Payer: Self-pay | Admitting: Family Medicine

## 2022-08-16 ENCOUNTER — Other Ambulatory Visit: Payer: Self-pay | Admitting: Family Medicine

## 2022-08-16 DIAGNOSIS — Z1231 Encounter for screening mammogram for malignant neoplasm of breast: Secondary | ICD-10-CM

## 2022-09-06 ENCOUNTER — Encounter (INDEPENDENT_AMBULATORY_CARE_PROVIDER_SITE_OTHER): Payer: Self-pay

## 2022-10-03 ENCOUNTER — Ambulatory Visit (INDEPENDENT_AMBULATORY_CARE_PROVIDER_SITE_OTHER): Payer: 59 | Admitting: Family Medicine

## 2022-10-03 ENCOUNTER — Other Ambulatory Visit: Payer: Self-pay | Admitting: Family Medicine

## 2022-10-03 ENCOUNTER — Encounter: Payer: Self-pay | Admitting: Family Medicine

## 2022-10-03 VITALS — BP 146/100 | HR 80 | Temp 97.6°F | Ht 64.0 in | Wt 173.6 lb

## 2022-10-03 DIAGNOSIS — F430 Acute stress reaction: Secondary | ICD-10-CM | POA: Diagnosis not present

## 2022-10-03 DIAGNOSIS — I10 Essential (primary) hypertension: Secondary | ICD-10-CM | POA: Diagnosis not present

## 2022-10-03 DIAGNOSIS — Z780 Asymptomatic menopausal state: Secondary | ICD-10-CM

## 2022-10-03 DIAGNOSIS — M810 Age-related osteoporosis without current pathological fracture: Secondary | ICD-10-CM

## 2022-10-03 DIAGNOSIS — Z0000 Encounter for general adult medical examination without abnormal findings: Secondary | ICD-10-CM

## 2022-10-03 DIAGNOSIS — Z0001 Encounter for general adult medical examination with abnormal findings: Secondary | ICD-10-CM

## 2022-10-03 LAB — COMPREHENSIVE METABOLIC PANEL
ALT: 16 U/L (ref 0–35)
AST: 22 U/L (ref 0–37)
Albumin: 4.3 g/dL (ref 3.5–5.2)
Alkaline Phosphatase: 83 U/L (ref 39–117)
BUN: 20 mg/dL (ref 6–23)
CO2: 25 meq/L (ref 19–32)
Calcium: 9.4 mg/dL (ref 8.4–10.5)
Chloride: 106 meq/L (ref 96–112)
Creatinine, Ser: 0.94 mg/dL (ref 0.40–1.20)
GFR: 64.47 mL/min (ref >60.00)
Glucose, Bld: 85 mg/dL (ref 70–99)
Potassium: 3.9 meq/L (ref 3.5–5.1)
Sodium: 139 meq/L (ref 135–145)
Total Bilirubin: 1.6 mg/dL — ABNORMAL HIGH (ref 0.2–1.2)
Total Protein: 7.2 g/dL (ref 6.0–8.3)

## 2022-10-03 LAB — CBC WITH DIFFERENTIAL/PLATELET
Basophils Absolute: 0.1 10*3/uL (ref 0.0–0.1)
Basophils Relative: 0.9 % (ref 0.0–3.0)
Eosinophils Absolute: 0 10*3/uL (ref 0.0–0.7)
Eosinophils Relative: 0.6 % (ref 0.0–5.0)
HCT: 45.4 % (ref 36.0–46.0)
Hemoglobin: 15.1 g/dL — ABNORMAL HIGH (ref 12.0–15.0)
Lymphocytes Relative: 29.3 % (ref 12.0–46.0)
Lymphs Abs: 1.8 10*3/uL (ref 0.7–4.0)
MCHC: 33.2 g/dL (ref 30.0–36.0)
MCV: 89.2 fl (ref 78.0–100.0)
Monocytes Absolute: 0.4 10*3/uL (ref 0.1–1.0)
Monocytes Relative: 7 % (ref 3.0–12.0)
Neutro Abs: 3.9 10*3/uL (ref 1.4–7.7)
Neutrophils Relative %: 62.2 % (ref 43.0–77.0)
Platelets: 281 10*3/uL (ref 150.0–400.0)
RBC: 5.09 Mil/uL (ref 3.87–5.11)
RDW: 13.8 % (ref 11.5–15.5)
WBC: 6.3 10*3/uL (ref 4.0–10.5)

## 2022-10-03 LAB — LIPID PANEL
Cholesterol: 208 mg/dL — ABNORMAL HIGH (ref 0–200)
HDL: 63.8 mg/dL (ref >39.00)
LDL Cholesterol: 124 mg/dL — ABNORMAL HIGH (ref 0–99)
NonHDL: 144.62
Total CHOL/HDL Ratio: 3
Triglycerides: 103 mg/dL (ref 0.0–149.0)
VLDL: 20.6 mg/dL (ref 0.0–40.0)

## 2022-10-03 LAB — VITAMIN D 25 HYDROXY (VIT D DEFICIENCY, FRACTURES): VITD: 95.23 ng/mL (ref 30.00–100.00)

## 2022-10-03 LAB — TSH: TSH: 2.51 u[IU]/mL (ref 0.35–5.50)

## 2022-10-03 MED ORDER — ALPRAZOLAM 0.5 MG PO TABS: 0.5000 mg | ORAL_TABLET | Freq: Every day | ORAL | 0 refills | Status: DC | PRN

## 2022-10-03 MED ORDER — METOPROLOL SUCCINATE ER 25 MG PO TB24: 25.0000 mg | ORAL_TABLET | Freq: Every day | ORAL | 3 refills | Status: DC

## 2022-10-03 NOTE — Progress Notes (Signed)
Stop Subjective  Chief Complaint  Patient presents with   Annual Exam    Pt here for Annual Exam and is currently fasting     HPI: Madison Ray is a 62 y.o. female who presents to [facility] today for a Female Wellness Visit. She also has the concerns and/or needs as listed above in the chief complaint. These will be addressed in addition to the Health Maintenance Visit.   Wellness Visit: annual visit with health maintenance review and exam without Pap  Health maintenance: Due for mammogram.  Pap smear and colon cancer screens are current. Chronic disease f/u and/or acute problem visit: (deemed necessary to be done in addition to the wellness visit): Unfortunately, patient has had a very difficult year.  Elderly father, 3 years old is suffering from dementia.  She has had a very difficult time getting him moved from [place] here to [place].  Currently in assisted living.  He has behavioral outburst that make his stay at the assisted living facility tentative.  He has failed home health care due to being inappropriate with nurses.  He can be cantankerous and harsh.  Patient has struggled to meet all of the demands of taking care of him.  She is tired, not sleeping, anxious, depressed.  On top of this, her sister is suffering from a chronic disease.  She has little support however her husband is trying to be helpful. Elevated blood pressure: We have been monitoring her blood pressure over the last several years, she has been in the prehypertension range.  However today her blood pressure is very elevated.  This is likely due to her mental state.  She denies chest pain, shortness of breath or lower extremity edema. Osteoporosis: She is on a drug holiday and due for bone density.  No fractures.   Assessment  1. Annual physical exam   2. Essential hypertension   3. Age-related osteoporosis without current pathological fracture   4. Asymptomatic menopausal  state   5. Stress reaction      Plan  Female Wellness Visit: Age appropriate Health Maintenance and Prevention measures were discussed with patient. Included topics are cancer screening recommendations, ways to keep healthy (see AVS) including dietary and exercise recommendations, regular eye and dental care, use of seat belts, and avoidance of moderate alcohol use and tobacco use.  Patient will schedule her mammogram BMI: discussed patient's BMI and encouraged positive lifestyle modifications to help get to or maintain a target BMI. HM needs and immunizations were addressed and ordered. See below for orders. See HM and immunization section for updates.  She defers COVID and flu shots at this time.  She will get in with her husband Routine labs and screening tests ordered including cmp, cbc and lipids where appropriate. Discussed recommendations regarding Vit D and calcium supplementation (see AVS)  Chronic disease management visit and/or acute problem visit: Hypertension: New diagnosis.  Start Toprol-XL 25 mg daily.  This may also help her anxiety.  Check renal function and electrolytes Patient to schedule bone density to follow-up on her osteoporosis.  She has been without medications for 2 years. Had a long discussion regarding her stress reaction/adjustment disorder with depression and anxiety: Patient needs sleep, will use Xanax at night and then during the day as needed for anxiety symptoms.  Also start Lexapro 10 mg daily.  Discussed and appropriate expectations.  Will need close follow-up.  Counseling done to help manage the difficulties of an aging father  with dementia.  Self-care discussed  Follow up: 6 weeks for recheck Orders Placed This Encounter  Procedures   DG Bone Density   VITAMIN D 25 Hydroxy (Vit-D Deficiency, Fractures)   CBC with Differential/Platelet   Comprehensive metabolic panel   Lipid panel   TSH   Meds ordered this encounter  Medications   ALPRAZolam (XANAX)  0.5 MG tablet    Sig: Take 1 tablet (0.5 mg total) by mouth daily as needed for anxiety.    Dispense:  30 tablet    Refill:  0   metoprolol succinate (TOPROL-XL) 25 MG 24 hr tablet    Sig: Take 1 tablet (25 mg total) by mouth daily.    Dispense:  90 tablet    Refill:  3      Body mass index is 29.8 kg/m. Wt Readings from Last 3 Encounters:  10/03/22 173 lb 9.6 oz (78.7 kg)  09/26/21 172 lb 12.8 oz (78.4 kg)  03/28/21 170 lb (77.1 kg)     Patient Active Problem List   Diagnosis Date Noted Date Diagnosed   Prehypertension 09/28/2020    Benign heart murmur 09/28/2020    Osteoporosis 01/03/2018     DEXA 2014 t = -2.6 lowest; started fosamax and imprved  DEXA 2016 t = -2.2 lowest, improved on fosamax  DEXA 02/2018 T = - 1.1 lowest, on fosamax. May consider a drug holiday. DEXA 10/2020 T=-1.8 lowest, radius, drug holiday. Recheck 2 years.    Osteoarthritis of knees, bilateral 01/03/2018     Clinical diagnosis    Health Maintenance  Topic Date Due   MAMMOGRAM  08/04/2022   COVID-19 Vaccine (5 - 2023-24 season) 10/19/2022 (Originally 09/23/2022)   INFLUENZA VACCINE  04/22/2023 (Originally 08/23/2022)   DEXA SCAN  11/08/2022   Fecal DNA (Cologuard)  11/29/2023   PAP SMEAR-Modifier  09/28/2025   DTaP/Tdap/Td (2 - Td or Tdap) 01/04/2028   Hepatitis C Screening  Completed   HIV Screening  Completed   Zoster Vaccines- Shingrix  Completed   HPV VACCINES  Aged Out   Colonoscopy  Discontinued   Immunization History  Administered Date(s) Administered   Influenza, Quadrivalent, Recombinant, Inj, Pf 10/28/2017   Influenza-Unspecified 09/26/2018, 10/27/2019, 10/27/2020   PFIZER(Purple Top)SARS-COV-2 Vaccination 03/21/2019, 04/13/2019, 11/24/2019, 05/03/2020   Tdap 01/03/2018   Zoster Recombinant(Shingrix) 11/08/2016, 01/08/2017   We updated and reviewed the patient's past history in detail and it is documented below. Allergies: Patient has No Known Allergies. Past Medical  History Patient  has a past medical history of Anxiety, Arthritis, Heart murmur, Osteoarthritis of knees, bilateral (01/03/2018), and Osteoporosis. Past Surgical History Patient  has no past surgical history on file. Family History: Patient family history includes Arthritis in her mother and sister; COPD in her mother; Cancer in her maternal grandmother; Heart attack in her father; Hyperlipidemia in her father and mother; Hypertension in her father and mother; Hyperthyroidism in her daughter; Hypothyroidism in her mother; Stroke in her mother. Social History:  Patient  reports that she has never smoked. She has never used smokeless tobacco. She reports that she does not currently use alcohol. She reports that she does not use drugs.  Review of Systems: Constitutional: negative for fever or malaise Ophthalmic: negative for photophobia, double vision or loss of vision Cardiovascular: negative for chest pain, dyspnea on exertion, or new LE swelling Respiratory: negative for SOB or persistent cough Gastrointestinal: negative for abdominal pain, change in bowel habits or melena Genitourinary: negative for dysuria or gross hematuria, no abnormal uterine  bleeding or disharge Musculoskeletal: negative for new gait disturbance or muscular weakness Integumentary: negative for new or persistent rashes, no breast lumps Neurological: negative for TIA or stroke symptoms Psychiatric: negative for SI or delusions Allergic/Immunologic: negative for hives  Patient Care Team    Relationship Specialty Notifications Start End  Willow Ora, MD PCP - General Family Medicine  01/03/18     Objective  Vitals: BP (!) 146/100   Pulse 80   Temp 97.6 F (36.4 C)   Ht 5\' 4"  (1.626 m)   Wt 173 lb 9.6 oz (78.7 kg)   SpO2 99%   BMI 29.80 kg/m  General:  Well developed, well nourished, no acute distress  Psych:  Alert and orientedx3, tearful at times anxious appearing, good insight HEENT:  Normocephalic,  atraumatic, non-icteric sclera,  supple neck without adenopathy, mass or thyromegaly Cardiovascular:  Normal S1, S2, RRR without gallop, rub or murmur Respiratory:  Good breath sounds bilaterally, CTAB with normal respiratory effort Gastrointestinal: normal bowel sounds, soft, non-tender, no noted masses. No HSM MSK: extremities without edema, joints without erythema or swelling Neurologic:    Mental status is normal.  Gross motor and sensory exams are normal.  No tremor  Commons side effects, risks, benefits, and alternatives for medications and treatment plan prescribed today were discussed, and the patient expressed understanding of the given instructions. Patient is instructed to call or message via MyChart if he/she has any questions or concerns regarding our treatment plan. No barriers to understanding were identified. We discussed Red Flag symptoms and signs in detail. Patient expressed understanding regarding what to do in case of urgent or emergency type symptoms.  Medication list was reconciled, printed and provided to the patient in AVS. Patient instructions and summary information was reviewed with the patient as documented in the AVS. This note was prepared with assistance of Dragon voice recognition software. Occasional wrong-word or sound-a-like substitutions may have occurred due to the inherent limitations of voice recognition software

## 2022-10-03 NOTE — Patient Instructions (Addendum)
Please return in 6-8 weeks to recheck blood pressure and mood.   I will release your lab results to you on your MyChart account with further instructions. You may see the results before I do, but when I review them I will send you a message with my report or have my assistant call you if things need to be discussed. Please reply to my message with any questions. Thank you!   If you have any questions or concerns, please don't hesitate to send me a message via MyChart or call the office at [phone]. Thank you for visiting with [place] today! It's our pleasure caring for you.   Please call the office checked below to schedule your appointment for your mammogram and/or bone density screen (the checked studies were ordered): [x]   Mammogram  [x]   Bone Density  [x]   [facility]     [address] [place], [place]        [phone]         []   [facility]  [address] [place], [place]  [phone]   Stress, Adult Stress is a normal reaction to life events. Stress is what you feel when life demands more than you are used to, or more than you think you can handle. Some stress can be useful, such as studying for a test or meeting a deadline at work. Stress that occurs too often or for too long can cause problems. Long-lasting stress is called chronic stress. Chronic stress can affect your emotional health and interfere with relationships and normal daily activities. Too much stress can weaken your body's defense system (immune system) and increase your risk for physical illness. If you already have a medical problem, stress can make it worse. What are the causes? All sorts of life events can cause stress. An event that causes stress for one person may not be stressful for someone else. Major life events, whether positive or negative, commonly cause stress. Examples include: Losing a job or starting a new job. Losing a loved one. Moving to a new town  or home. Getting married or divorced. Having a baby. Getting injured or sick. Less obvious life events can also cause stress, especially if they occur day after day or in combination with each other. Examples include: Working long hours. Driving in traffic. Caring for children. Being in debt. Being in a difficult relationship. What are the signs or symptoms? Stress can cause emotional and physical symptoms and can lead to unhealthy behaviors. These include the following: Emotional symptoms Anxiety. This is feeling worried, afraid, on edge, overwhelmed, or out of control. Anger, including irritation or impatience. Depression. This is feeling sad, down, helpless, or guilty. Trouble focusing, remembering, or making decisions. Physical symptoms Aches and pains. These may affect your head, neck, back, stomach, or other areas of your body. Tight muscles or a clenched jaw. Low energy. Trouble sleeping. Unhealthy behaviors Eating to feel better (overeating) or skipping meals. Working too much or putting off tasks. Smoking, drinking alcohol, or using drugs to feel better. How is this diagnosed? A stress disorder is diagnosed through an assessment by your health care provider. A stress disorder may be diagnosed based on: Your symptoms and any stressful life events. Your medical history. Tests to rule out other causes of your symptoms. Depending on your condition, your health care provider may refer you to a specialist for further evaluation. How is this treated?  Stress  management techniques are the recommended treatment for stress. Medicine is not typically recommended for treating stress. Techniques to reduce your reaction to stressful life events include: Identifying stress. Monitor yourself for symptoms of stress and notice what causes stress for you. These skills may help you to avoid or prepare for stressful events. Managing time. Set your priorities, keep a calendar of events, and  learn to say no. These actions can help you avoid taking on too much. Techniques for dealing with stress include: Rethinking the problem. Try to think realistically about stressful events rather than ignoring them or overreacting. Try to find the positives in a stressful situation rather than focusing on the negatives. Exercise. Physical exercise can release both physical and emotional tension. The key is to find a form of exercise that you enjoy and do it regularly. Relaxation techniques. These relax the body and mind. Find one or more that you enjoy and use the techniques regularly. Examples include: Meditation, deep breathing, or progressive relaxation techniques. Yoga or tai chi. Biofeedback, mindfulness techniques, or journaling. Listening to music, being in nature, or taking part in other hobbies. Practicing a healthy lifestyle. Eat a balanced diet, drink plenty of water, limit or avoid caffeine, and get plenty of sleep. Having a strong support network. Spend time with family, friends, or other people you enjoy being around. Express your feelings and talk things over with someone you trust. Counseling or talk therapy with a mental health provider may help if you are having trouble managing stress by yourself. Follow these instructions at home: Lifestyle  Avoid drugs. Do not use any products that contain nicotine or tobacco. These products include cigarettes, chewing tobacco, and vaping devices, such as e-cigarettes. If you need help quitting, ask your health care provider. If you drink alcohol: Limit how much you have to: 0-1 drink a day for women who are not pregnant. 0-2 drinks a day for men. Know how much alcohol is in a drink. In the [place] one drink equals one 12 oz bottle of beer (355 mL), one 5 oz glass of wine (148 mL), or one 1 oz glass of hard liquor (44 mL). Do not use alcohol or drugs to relax. Eat a balanced diet that includes fresh fruits and vegetables, whole grains, lean  meats, fish, eggs, beans, and low-fat dairy. Avoid processed foods and foods high in added fat, sugar, and salt. Exercise at least 30 minutes on 5 or more days each week. Get 7-8 hours of sleep each night. General instructions  Practice stress management techniques as told by your health care provider. Drink enough fluid to keep your urine pale yellow. Take over-the-counter and prescription medicines only as told by your health care provider. Keep all follow-up visits. This is important. Contact a health care provider if: Your symptoms get worse. You have new symptoms. You feel overwhelmed by your problems and can no longer manage them by yourself. Get help right away if: You have thoughts of hurting yourself or others. Get help right awayif you feel like you may hurt yourself or others, or have thoughts about taking your own life. Go to your nearest emergency room or: Call 911. Call the National Suicide Prevention Lifeline at (769) 168-5421 or 988. This is open 24 hours a day. Text the Crisis Text Line at (575)602-0926. Summary Stress is a normal reaction to life events. It can cause problems if it happens too often or for too long. Practicing stress management techniques is the best way to treat stress. Counseling  or talk therapy with a mental health provider may help if you are having trouble managing stress by yourself. This information is not intended to replace advice given to you by your health care provider. Make sure you discuss any questions you have with your health care provider. Document Revised: 08/18/2020 Document Reviewed: 08/18/2020 Elsevier Patient Education  2024 [employer].

## 2022-10-08 NOTE — Progress Notes (Signed)
Labs reviewed.  The 10-year ASCVD risk score (Arnett DK, et al., 2019) is: 7.4% _ will discuss statin in future visit. New onset htn.    Values used to calculate the score:     Age: 62 years     Sex: Female     Is Non-Hispanic African American: No     Diabetic: No     Tobacco smoker: No     Systolic Blood Pressure: 146 mmHg     Is BP treated: Yes     HDL Cholesterol: 63.8 mg/dL     Total Cholesterol: 208 mg/dL

## 2022-10-16 ENCOUNTER — Ambulatory Visit
Admission: RE | Admit: 2022-10-16 | Discharge: 2022-10-16 | Disposition: A | Discharge status: Home / Self Care | Payer: 59 | Source: Ambulatory Visit | Attending: Family Medicine | Admitting: Family Medicine

## 2022-10-16 DIAGNOSIS — Z1231 Encounter for screening mammogram for malignant neoplasm of breast: Secondary | ICD-10-CM

## 2022-11-14 ENCOUNTER — Encounter: Payer: Self-pay | Admitting: Family Medicine

## 2022-11-14 ENCOUNTER — Ambulatory Visit: Payer: 59 | Admitting: Family Medicine

## 2022-11-14 VITALS — BP 116/70 | HR 54 | Temp 97.8°F | Ht 64.0 in | Wt 176.2 lb

## 2022-11-14 DIAGNOSIS — F4322 Adjustment disorder with anxiety: Secondary | ICD-10-CM | POA: Diagnosis not present

## 2022-11-14 DIAGNOSIS — I10 Essential (primary) hypertension: Secondary | ICD-10-CM | POA: Insufficient documentation

## 2022-11-14 DIAGNOSIS — F430 Acute stress reaction: Secondary | ICD-10-CM | POA: Diagnosis not present

## 2022-11-14 HISTORY — DX: Essential (primary) hypertension: I10

## 2022-11-14 MED ORDER — ESCITALOPRAM OXALATE 10 MG PO TABS: 10.0000 mg | ORAL_TABLET | Freq: Every day | ORAL | 3 refills | Status: DC

## 2022-11-14 MED ORDER — ALPRAZOLAM 0.5 MG PO TABS: 0.2500 mg | ORAL_TABLET | Freq: Every evening | ORAL | 0 refills | Status: DC | PRN

## 2022-11-14 NOTE — Progress Notes (Signed)
Subjective  CC:  Chief Complaint  Patient presents with   Hypertension    HPI: Madison Ray is a 62 y.o. female who presents to the office today to address the problems listed above in the chief complaint. Hypertension f/u:  started low dose metoprolol xl 25 last month. She has had very good response. Bp is now normal. And bb is helping with physical anxiety responses. She feels less anxious and "pressured" when she is dealing with the stress of her father's illness (dementia with aggressive behavior). Fortunately he remains in the assisted living facility. She has no cp or sob. She is compliant with meds.  Reviewed cholesterol levels. Working on eating much healthier. Low fat. Likes dairy. Less red meat. Stress reaction/anxiety: xanax helped with sleep. Makes her too tired to take during the day. No panic sxs. Still with anxiety sxs.   Assessment  1. Essential hypertension   2. Stress reaction   3. Adjustment reaction with anxiety      Plan   Hypertension f/u: BP control is well controlled. Continue bb. Discussed healthy diet and low cholesterol diet. Will monitor. No cholesterol meds indicated at this point.  Start lexapro after discussion/education. Can use xanax prn for sleep/anxiety. Recheck 3 months.   Education regarding management of these chronic disease states was given. Management strategies discussed on successive visits include dietary and exercise recommendations, goals of achieving and maintaining IBW, and lifestyle modifications aiming for adequate sleep and minimizing stressors.   Follow up: 3 mo for recheck.  No orders of the defined types were placed in this encounter.  Meds ordered this encounter  Medications   escitalopram (LEXAPRO) 10 MG tablet    Sig: Take 1 tablet (10 mg total) by mouth daily.    Dispense:  90 tablet    Refill:  3   ALPRAZolam (XANAX) 0.5 MG tablet    Sig: Take 0.5-1 tablets (0.25-0.5 mg total) by mouth at bedtime as needed for  anxiety or sleep.    Dispense:  30 tablet    Refill:  0      BP Readings from Last 3 Encounters:  11/14/22 116/70  10/03/22 (!) 146/100  09/26/21 120/80   Wt Readings from Last 3 Encounters:  11/14/22 176 lb 3.2 oz (79.9 kg)  10/03/22 173 lb 9.6 oz (78.7 kg)  09/26/21 172 lb 12.8 oz (78.4 kg)    Lab Results  Component Value Date   CHOL 208 (H) 10/03/2022   CHOL 191 09/26/2021   CHOL 176 09/28/2020   Lab Results  Component Value Date   HDL 63.80 10/03/2022   HDL 57.60 09/26/2021   HDL 42.20 09/28/2020   Lab Results  Component Value Date   LDLCALC 124 (H) 10/03/2022   LDLCALC 110 (H) 09/26/2021   LDLCALC 116 (H) 09/28/2020   Lab Results  Component Value Date   TRIG 103.0 10/03/2022   TRIG 117.0 09/26/2021   TRIG 88.0 09/28/2020   Lab Results  Component Value Date   CHOLHDL 3 10/03/2022   CHOLHDL 3 09/26/2021   CHOLHDL 4 09/28/2020   No results found for: "LDLDIRECT" Lab Results  Component Value Date   CREATININE 0.94 10/03/2022   BUN 20 10/03/2022   NA 139 10/03/2022   K 3.9 10/03/2022   CL 106 10/03/2022   CO2 25 10/03/2022    The 10-year ASCVD risk score (Arnett DK, et al., 2019) is: 4.7%   Values used to calculate the score:     Age: 14  years     Sex: Female     Is Non-Hispanic African American: No     Diabetic: No     Tobacco smoker: No     Systolic Blood Pressure: 116 mmHg     Is BP treated: Yes     HDL Cholesterol: 63.8 mg/dL     Total Cholesterol: 208 mg/dL  I reviewed the patients updated PMH, FH, and SocHx.    Patient Active Problem List   Diagnosis Date Noted   Essential hypertension 11/14/2022   Adjustment reaction with anxiety 11/14/2022   Benign heart murmur 09/28/2020   Osteoporosis 01/03/2018   Osteoarthritis of knees, bilateral 01/03/2018    Allergies: Patient has no known allergies.  Social History: Patient  reports that she has never smoked. She has never used smokeless tobacco. She reports that she does not currently  use alcohol. She reports that she does not use drugs.  Current Meds  Medication Sig   Calcium Carbonate-Vit D-Min (CALCIUM 1200 PO) Take by mouth.   calcium-vitamin D (OSCAL WITH D) 500-200 MG-UNIT tablet Take 1 tablet by mouth.   Cholecalciferol (VITAMIN D3) 125 MCG (5000 UT) TABS Take by mouth.   escitalopram (LEXAPRO) 10 MG tablet Take 1 tablet (10 mg total) by mouth daily.   Glucosamine 500 MG CAPS Take by mouth.   metoprolol succinate (TOPROL-XL) 25 MG 24 hr tablet Take 1 tablet (25 mg total) by mouth daily.   Misc Natural Products (OSTEO BI-FLEX ADV JOINT SHIELD PO) Take 2 tablets by mouth daily.   terbinafine (LAMISIL) 250 MG tablet TAKE 1 TABLET BY MOUTH EVERY DAY   [DISCONTINUED] ALPRAZolam (XANAX) 0.5 MG tablet Take 1 tablet (0.5 mg total) by mouth daily as needed for anxiety.    Review of Systems: Cardiovascular: negative for chest pain, palpitations, leg swelling, orthopnea Respiratory: negative for SOB, wheezing or persistent cough Gastrointestinal: negative for abdominal pain Genitourinary: negative for dysuria or gross hematuria  Objective  Vitals: BP 116/70   Pulse (!) 54   Temp 97.8 F (36.6 C)   Ht 5\' 4"  (1.626 m)   Wt 176 lb 3.2 oz (79.9 kg)   SpO2 99%   BMI 30.24 kg/m  General: no acute distress  Psych:  Alert and oriented, anxious mood and affect HEENT:  Normocephalic, atraumatic, supple neck   Commons side effects, risks, benefits, and alternatives for medications and treatment plan prescribed today were discussed, and the patient expressed understanding of the given instructions. Patient is instructed to call or message via MyChart if he/she has any questions or concerns regarding our treatment plan. No barriers to understanding were identified. We discussed Red Flag symptoms and signs in detail. Patient expressed understanding regarding what to do in case of urgent or emergency type symptoms.  Medication list was reconciled, printed and provided to the  patient in AVS. Patient instructions and summary information was reviewed with the patient as documented in the AVS. This note was prepared with assistance of Dragon voice recognition software. Occasional wrong-word or sound-a-like substitutions may have occurred due to the inherent limitation

## 2022-11-14 NOTE — Patient Instructions (Signed)
Please return in 3 months for recheck mood and blood pressure  If you have any questions or concerns, please don't hesitate to send me a message via MyChart or call the office at [phone]. Thank you for visiting with [place] today! It's our pleasure caring for you.

## 2022-11-22 IMAGING — MG DIGITAL SCREENING BILAT W/ CAD
4 series · 4 of 4 positions shown · non-contrast
Comparison: Previous exam(s).

CLINICAL DATA: Screening.

EXAM:
DIGITAL SCREENING BILATERAL MAMMOGRAM WITH CAD
TECHNIQUE: Bilateral screening digital craniocaudal and mediolateral oblique
mammograms were obtained. The images were evaluated with
computer-aided detection.

[L CC]
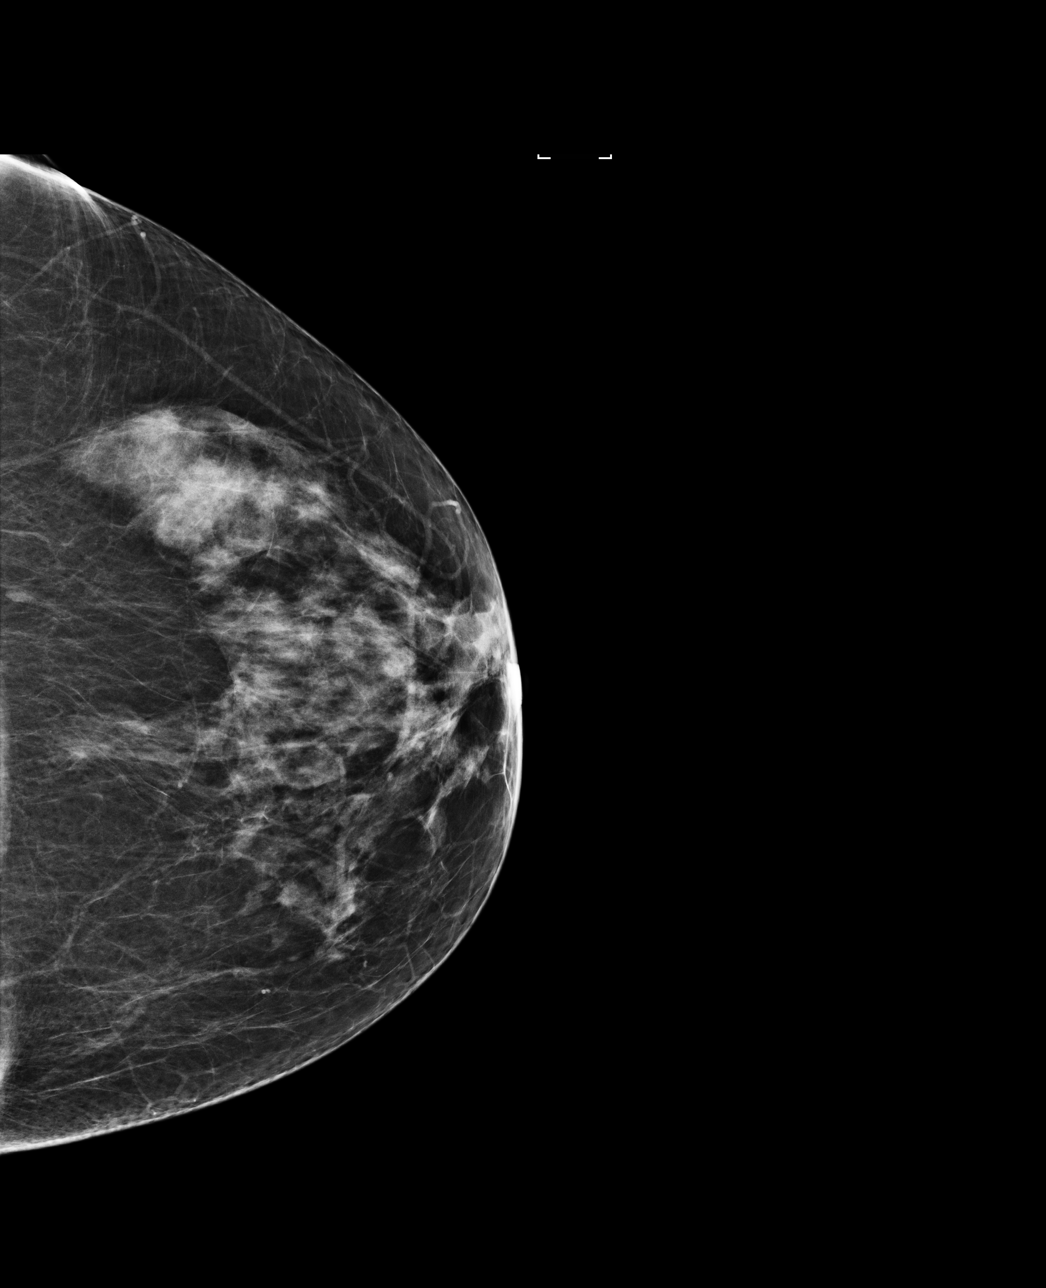

[R MLO]
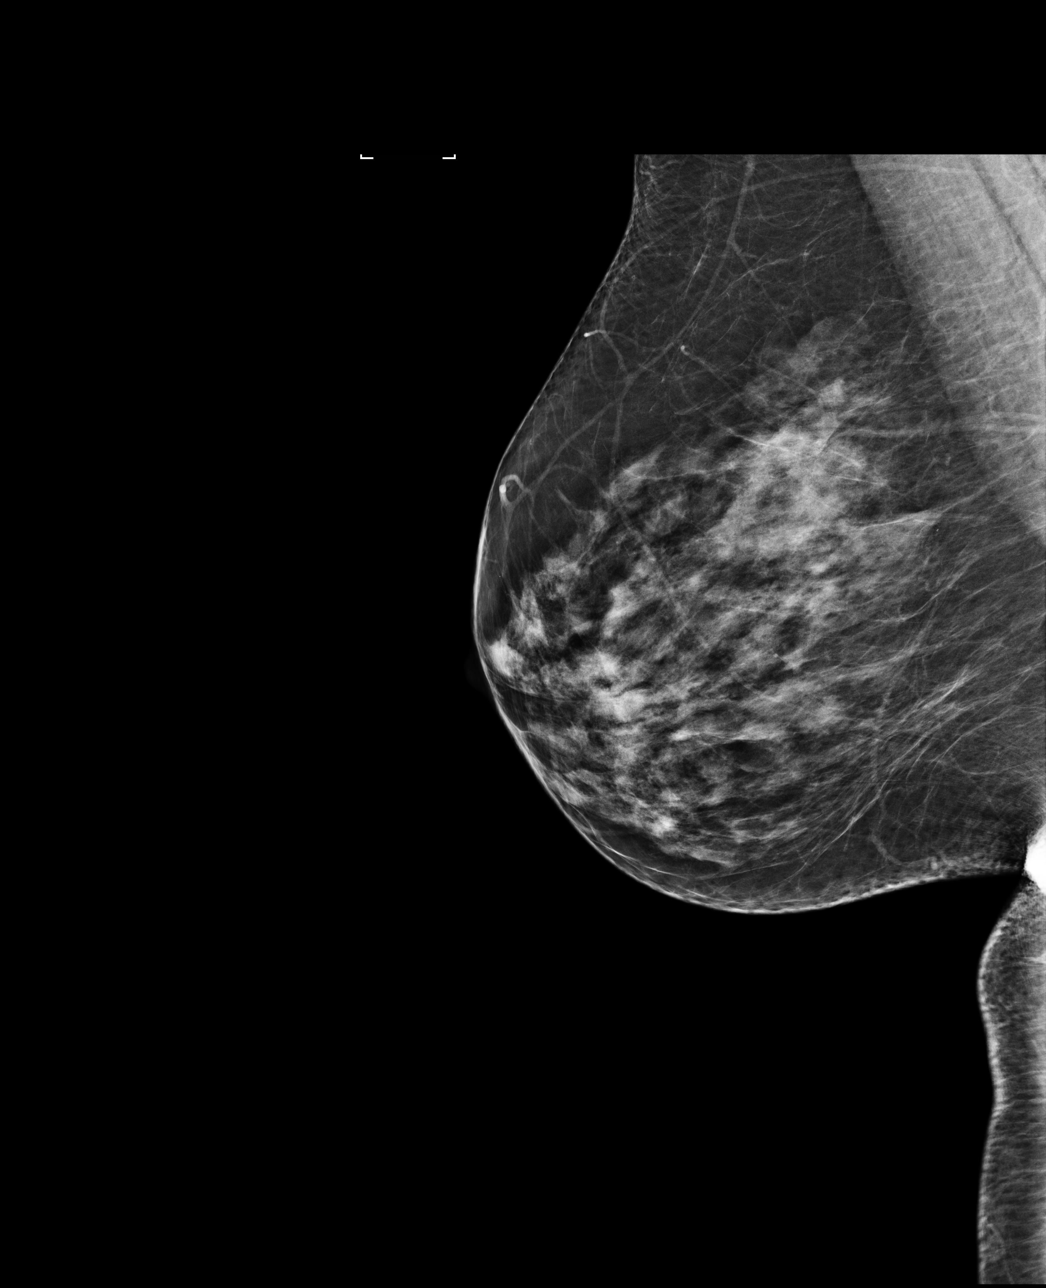

[L MLO]
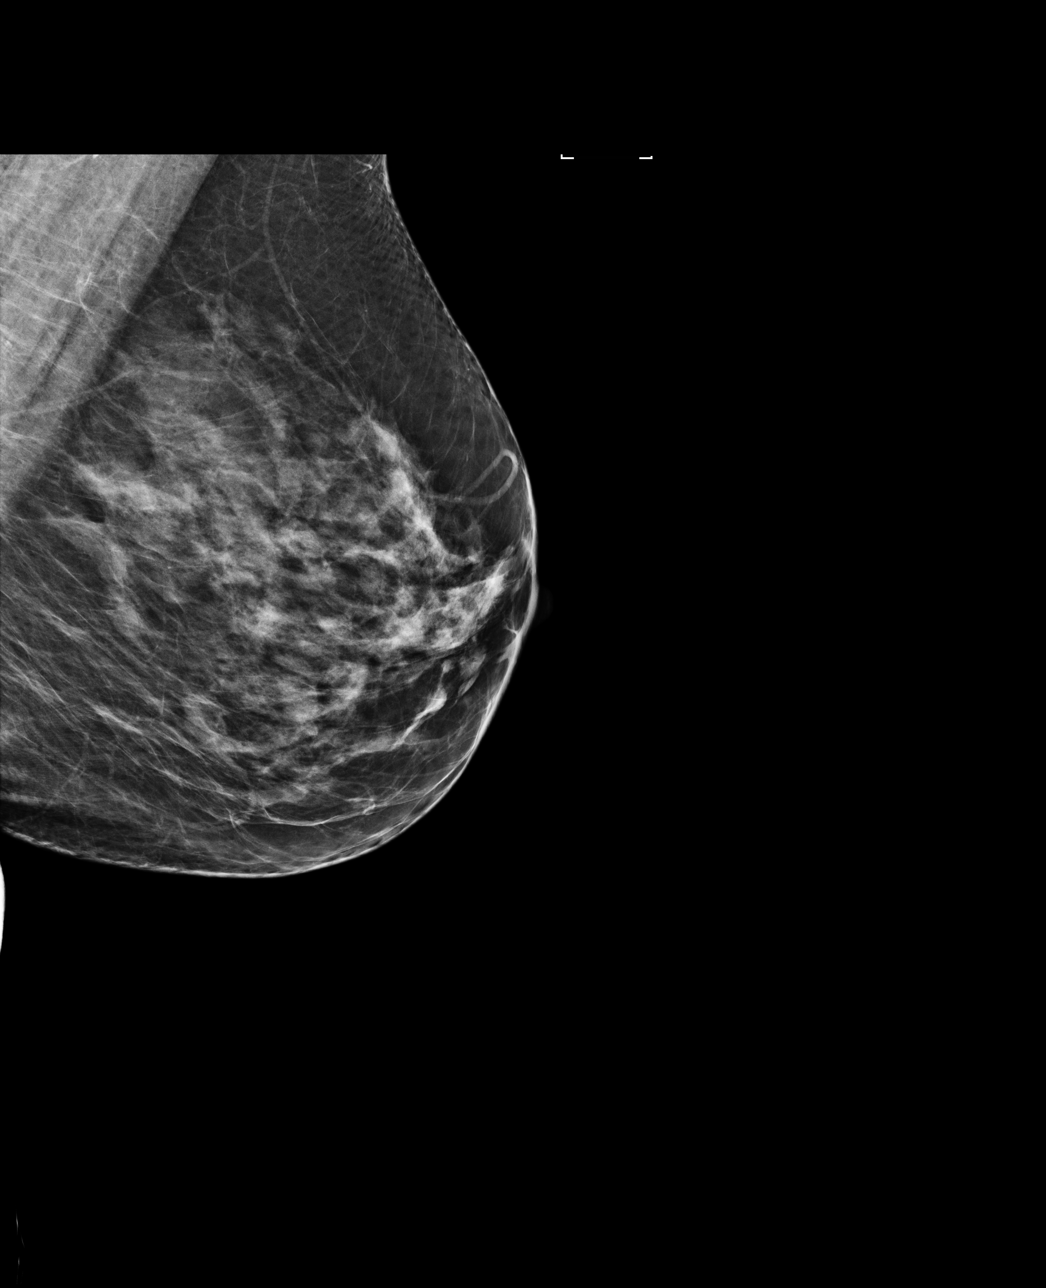

[R CC]
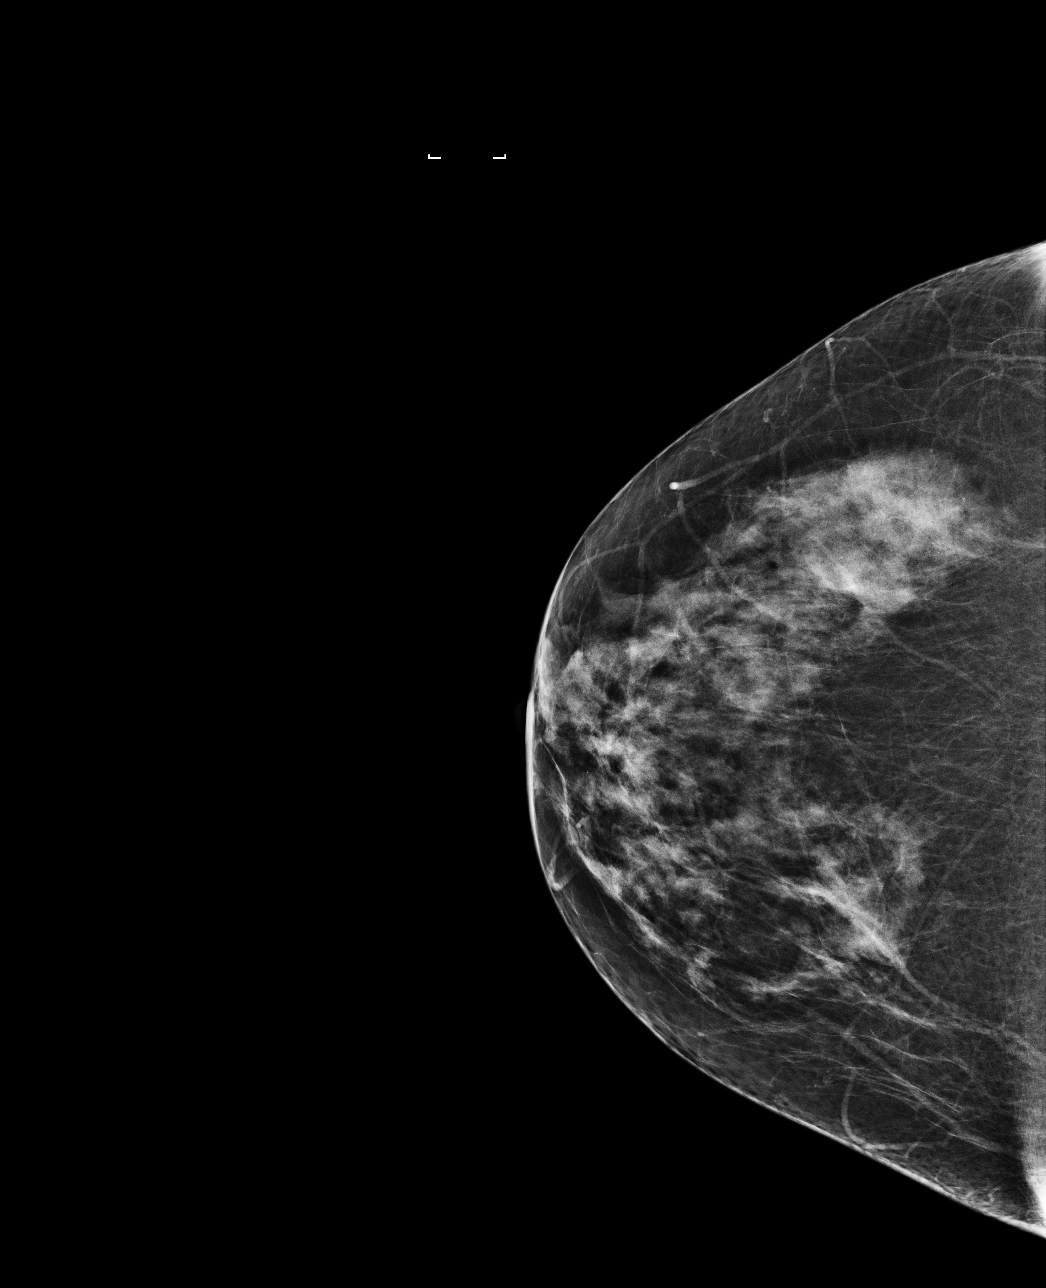

[4 of 4 positions shown; findings below may reference images not displayed]

ACR Breast Density Category c: The breast tissue is heterogeneously
dense, which may obscure small masses.
FINDINGS: There are no findings suspicious for malignancy.
IMPRESSION: No mammographic evidence of malignancy. A result letter of this
screening mammogram will be mailed directly to the patient.

RECOMMENDATION:
Screening mammogram in one year. (Code:[id number])

BI-RADS CATEGORY  1: Negative.

## 2023-02-09 ENCOUNTER — Other Ambulatory Visit: Payer: Self-pay | Admitting: Family Medicine

## 2023-02-14 ENCOUNTER — Ambulatory Visit: Payer: 59 | Admitting: Family Medicine

## 2023-02-14 ENCOUNTER — Encounter: Payer: Self-pay | Admitting: Family Medicine

## 2023-02-14 VITALS — BP 128/84 | HR 61 | Temp 97.7°F | Ht 64.0 in | Wt 178.4 lb

## 2023-02-14 DIAGNOSIS — Z636 Dependent relative needing care at home: Secondary | ICD-10-CM

## 2023-02-14 DIAGNOSIS — I10 Essential (primary) hypertension: Secondary | ICD-10-CM

## 2023-02-14 DIAGNOSIS — F4322 Adjustment disorder with anxiety: Secondary | ICD-10-CM | POA: Diagnosis not present

## 2023-02-14 MED ORDER — ALPRAZOLAM 0.5 MG PO TABS: 0.2500 mg | ORAL_TABLET | Freq: Every evening | ORAL | 1 refills | Status: AC | PRN

## 2023-02-14 MED ORDER — ESCITALOPRAM OXALATE 10 MG PO TABS: 10.0000 mg | ORAL_TABLET | Freq: Every day | ORAL | 3 refills | Status: DC

## 2023-02-14 NOTE — Patient Instructions (Signed)
Please return in October 2025 for your annual complete physical; please come fasting.   If you have any questions or concerns, please don't hesitate to send me a message via MyChart or call the office at [phone]. Thank you for visiting with Madison Ray today! It's our pleasure caring for you.   VISIT SUMMARY:  During today's visit, we discussed your ongoing stress and anxiety related to your father's health, your sister's condition, and your daughter's living situation. We reviewed your current medication regimen and its effectiveness in managing your symptoms. We also talked about your elevated blood pressure since returning from vacation and the importance of stress management.  YOUR PLAN:  -GENERALIZED ANXIETY DISORDER/ADJUSTMENT ANXIETY: Generalized Anxiety Disorder is a condition characterized by persistent and excessive worry about various aspects of life. Your current medication, Lexapro, has been effective in managing your symptoms, and you will continue with it. You can use Xanax as needed for acute stress, but be cautious about its regular use. Please monitor for any side effects or mood changes and follow up if you consider discontinuing any medication.  -CAREGIVER STRESS: Caregiver Stress occurs when the demands of caregiving become overwhelming. We discussed the importance of self-care, stress management, and possibly increasing your support systems or seeking additional help. Please consider these strategies to help manage your stress levels.  -HYPERTENSION: Hypertension, or high blood pressure, can be influenced by stress. Your blood pressure has been elevated since returning from vacation. Continue with your current antihypertensive medication and regularly monitor your blood pressure. Stress management techniques are also encouraged to help control your blood pressure.  INSTRUCTIONS:  Please schedule a physical examination in October. Contact Madison Ray if there are any changes in your mental  health or if you are considering discontinuing any medication. Follow up if you experience any new or worsening symptoms.

## 2023-02-14 NOTE — Progress Notes (Signed)
Subjective  CC:  Chief Complaint  Patient presents with   Anxiety    HPI: Madison Ray is a 63 y.o. female who presents to the office today to address the problems listed above in the chief complaint, mood problems. Discussed the use of AI scribe software for clinical note transcription with the patient, who gave verbal consent to proceed.  History of Present Illness   The patient presents with ongoing stress and anxiety related to familial issues. She reports that her father's health is deteriorating, with recent incidents of disruptive behavior and memory loss leading to a potential move to a memory care facility. This situation has been a significant source of stress for the patient, compounded by the uncertainty of finding a suitable facility for her father.  The patient also mentions her sister, who is living with a double lung transplant and is not doing well. The patient expresses concern about her sister's health, particularly when she does not hear from her for several days. Additionally, the patient's daughter recently expressed a desire to move into the patient's house with her own daughter, only to decide to buy a house with her husband two weeks later. These familial issues have contributed to the patient's stress levels.  Despite these stressors, the patient reports that her current medication regimen has been beneficial. Started lexapro 3 months ago.  She notes that she is less reactive to situations that would typically cause stress, and she is better able to manage her irritability, anxiety, and mood swings. The patient has needed to use Xanax a few times, particularly after stressful incidents involving her father. She expresses a desire to continue her current medication regimen, as she believes it is helping her cope with her current circumstances.  The patient does not report any side effects from her medication. She expresses concern about ensuring her prescriptions are  filled on time, as she does not want to risk being without her medication. The patient also mentions a recent vacation to [place], during which she felt relaxed and did not feel the need to take her medication as frequently. However, she acknowledges that her stress levels have increased since returning from vacation and dealing with her father's health issues.         02/14/2023    9:45 AM 11/14/2022    1:20 PM 10/03/2022    8:26 AM  Depression screen PHQ 2/9  Decreased Interest 0 0 0  Down, Depressed, Hopeless 0 0 0  PHQ - 2 Score 0 0 0      02/14/2023    9:45 AM 11/14/2022    1:20 PM 10/03/2022    8:26 AM 01/03/2018    3:50 PM  GAD 7 : Generalized Anxiety Score  Nervous, Anxious, on Edge 0 0 0 1  Control/stop worrying 0 0 0 1  Worry too much - different things 0 0 0 1  Trouble relaxing 0 0 0 0  Restless 0 0 0 0  Easily annoyed or irritable 0 0 0 0  Afraid - awful might happen 0 0 0 0  Total GAD 7 Score 0 0 0 3  Anxiety Difficulty Not difficult at all Not difficult at all Not difficult at all Not difficult at all     Assessment  1. Adjustment reaction with anxiety   2. Caregiver stress   3. Essential hypertension      Plan  Assessment and Plan    Generalized Anxiety Disorder Ongoing stress related to father's  deteriorating condition, sister's health issues, and daughter's fluctuating living situation. Lexapro has been effective in managing irritability, anxiety, and mood swings. Used Xanax during acute stress without side effects. Concerned about potential addiction to Xanax but finds it necessary during acute stress. Discussed risks and benefits of current medications and emphasized not using Xanax regularly for self-treatment. - Refill Lexapro with a 90-day supply - Continue Xanax as needed for acute stress - Monitor for side effects or mood changes - Follow up if considering discontinuation of medication  Caregiver Stress Significant stress due to father's  behavioral issues, sister's health, and daughter's living situation. Feels overwhelmed managing these responsibilities. Discussed self-care, stress management, and increasing support systems or seeking additional help for caregiving. - Encourage self-care and stress management - Consider increasing support systems or seeking additional help for caregiving - Follow up with a mental health professional if needed  Hypertension Blood pressure elevated since returning from vacation. Discussed importance of stress management techniques. Current medication regimen remains appropriate. - Monitor blood pressure regularly - Continue current antihypertensive medication - Encourage stress management techniques  Follow-up - Schedule physical examination in October - Contact if there are changes in mental health or considering discontinuation of medication - Follow up for any new or worsening symptoms.       No orders of the defined types were placed in this encounter.  Meds ordered this encounter  Medications   ALPRAZolam (XANAX) 0.5 MG tablet    Sig: Take 0.5-1 tablets (0.25-0.5 mg total) by mouth at bedtime as needed for anxiety or sleep.    Dispense:  30 tablet    Refill:  1    Please keep on file. thanks   escitalopram (LEXAPRO) 10 MG tablet    Sig: Take 1 tablet (10 mg total) by mouth daily.    Dispense:  90 tablet    Refill:  3    Please change to 90 day supply for next refill. Thanks.      I reviewed the patients updated PMH, FH, and SocHx.    Patient Active Problem List   Diagnosis Date Noted   Essential hypertension 11/14/2022   Adjustment reaction with anxiety 11/14/2022   Benign heart murmur 09/28/2020   Osteoporosis 01/03/2018   Osteoarthritis of knees, bilateral 01/03/2018   Current Meds  Medication Sig   Calcium Carbonate-Vit D-Min (CALCIUM 1200 PO) Take by mouth.   calcium-vitamin D (OSCAL WITH D) 500-200 MG-UNIT tablet Take 1 tablet by mouth.   Cholecalciferol  (VITAMIN D3) 125 MCG (5000 UT) TABS Take by mouth.   Glucosamine 500 MG CAPS Take by mouth.   metoprolol succinate (TOPROL-XL) 25 MG 24 hr tablet Take 1 tablet (25 mg total) by mouth daily.   Misc Natural Products (OSTEO BI-FLEX ADV JOINT SHIELD PO) Take 2 tablets by mouth daily.   terbinafine (LAMISIL) 250 MG tablet TAKE 1 TABLET BY MOUTH EVERY DAY   [DISCONTINUED] ALPRAZolam (XANAX) 0.5 MG tablet Take 0.5-1 tablets (0.25-0.5 mg total) by mouth at bedtime as needed for anxiety or sleep.   [DISCONTINUED] escitalopram (LEXAPRO) 10 MG tablet TAKE 1 TABLET BY MOUTH EVERY DAY    Allergies: Patient has no known allergies. Family history:  Patient family history includes Arthritis in her mother and sister; COPD in her mother; Cancer in her maternal grandmother; Heart attack in her father; Hyperlipidemia in her father and mother; Hypertension in her father and mother; Hyperthyroidism in her daughter; Hypothyroidism in her mother; Stroke in her mother. Social History   Socioeconomic  History   Marital status: Married    Spouse name: Not on file   Number of children: 2   Years of education: Not on file   Highest education level: Bachelor's degree (e.g., BA, AB, BS)  Occupational History    Employer: [employer]    Employer: [employer]  Tobacco Use   Smoking status: Never   Smokeless tobacco: Never  Vaping Use   Vaping status: Never Used  Substance and Sexual Activity   Alcohol use: Not Currently   Drug use: Never   Sexual activity: Not Currently    Birth control/protection: Post-menopausal  Other Topics Concern   Not on file  Social History Narrative   Not on file   Social Drivers of Health   Financial Resource Strain: Low Risk  (02/10/2023)   Overall Financial Resource Strain (CARDIA)    Difficulty of Paying Living Expenses: Not hard at all  Food Insecurity: No Food Insecurity (02/10/2023)   Hunger Vital Sign    Worried About Running Out of Food in the Last Year: Never  true    Ran Out of Food in the Last Year: Never true  Transportation Needs: No Transportation Needs (02/10/2023)   PRAPARE - Administrator, Civil Service (Medical): No    Lack of Transportation (Non-Medical): No  Physical Activity: Insufficiently Active (02/10/2023)   Exercise Vital Sign    Days of Exercise per Week: 3 days    Minutes of Exercise per Session: 30 min  Stress: Stress Concern Present (02/10/2023)   [employer] of Occupational Health - Occupational Stress Questionnaire    Feeling of Stress : To some extent  Social Connections: Moderately Integrated (02/10/2023)   Social Connection and Isolation Panel [NHANES]    Frequency of Communication with Friends and Family: More than three times a week    Frequency of Social Gatherings with Friends and Family: Twice a week    Attends Religious Services: More than 4 times per year    Active Member of [employer] or Organizations: No    Attends Engineer, structural: Not on file    Marital Status: Married     Review of Systems: Constitutional: Negative for fever malaise or anorexia Cardiovascular: negative for chest pain Respiratory: negative for SOB or persistent cough Gastrointestinal: negative for abdominal pain  Objective  Vitals: BP 128/84   Pulse 61   Temp 97.7 F (36.5 C)   Ht 5\' 4"  (1.626 m)   Wt 178 lb 6.4 oz (80.9 kg)   SpO2 99%   BMI 30.62 kg/m  General: no acute distress, well appearing, no apparent distress, well groomed Psych:  Alert and oriented x 3,anxious. normal affect    Commons side effects, risks, benefits, and alternatives for medications and treatment plan prescribed today were discussed, and the patient expressed understanding of the given instructions. Patient is instructed to call or message via MyChart if he/she has any questions or concerns regarding our treatment plan. No barriers to understanding were identified. We discussed Red Flag symptoms and signs in detail. Patient  expressed understanding regarding what to do in case of urgent or emergency type symptoms.  Medication list was reconciled, printed and provided to the patient in AVS. Patient instructions and summary information was reviewed with the patient as documented in the AVS. This note was prepared with assistance of Dragon voice recognition software. Occasional wrong-word or sound-a-like substitutions may have occurred due to the inherent limitations of voice recognition software

## 2023-02-22 ENCOUNTER — Other Ambulatory Visit: Payer: Self-pay | Admitting: Family Medicine

## 2023-04-11 ENCOUNTER — Ambulatory Visit
Admission: RE | Admit: 2023-04-11 | Discharge: 2023-04-11 | Disposition: A | Discharge status: Home / Self Care | Payer: 59 | Source: Ambulatory Visit | Attending: Family Medicine | Admitting: Family Medicine

## 2023-04-11 DIAGNOSIS — N958 Other specified menopausal and perimenopausal disorders: Secondary | ICD-10-CM | POA: Diagnosis not present

## 2023-04-11 DIAGNOSIS — E2839 Other primary ovarian failure: Secondary | ICD-10-CM | POA: Diagnosis not present

## 2023-04-11 DIAGNOSIS — M8588 Other specified disorders of bone density and structure, other site: Secondary | ICD-10-CM | POA: Diagnosis not present

## 2023-04-11 DIAGNOSIS — M810 Age-related osteoporosis without current pathological fracture: Secondary | ICD-10-CM

## 2023-04-11 DIAGNOSIS — Z780 Asymptomatic menopausal state: Secondary | ICD-10-CM

## 2023-04-28 ENCOUNTER — Encounter: Payer: Self-pay | Admitting: Family Medicine

## 2023-04-28 ENCOUNTER — Other Ambulatory Visit: Payer: Self-pay | Admitting: Family Medicine

## 2023-04-28 MED ORDER — ALENDRONATE SODIUM 70 MG PO TABS: 70.0000 mg | ORAL_TABLET | ORAL | 3 refills | Status: AC

## 2023-04-28 NOTE — Progress Notes (Signed)
 See mychart note Dear Ms. Rochele Raring, I have reviewed your bone density test results. Since stopping the fosamax, the results show a negative change, albeit mild. Therefore, please restart taking fosamax 70mg  weekly. I have sent this medication in to your pharmacy. We will recheck again in 2 years. Let me know if you have any questions. Sincerely, Dr. Mardelle Matte

## 2023-05-20 ENCOUNTER — Encounter: Payer: Self-pay | Admitting: Family Medicine

## 2023-05-22 ENCOUNTER — Ambulatory Visit: Admitting: Family Medicine

## 2023-05-22 ENCOUNTER — Encounter: Payer: Self-pay | Admitting: Family Medicine

## 2023-05-22 VITALS — BP 136/79 | HR 66 | Temp 97.7°F | Ht 64.0 in | Wt 176.4 lb

## 2023-05-22 DIAGNOSIS — F4322 Adjustment disorder with anxiety: Secondary | ICD-10-CM

## 2023-05-22 DIAGNOSIS — I10 Essential (primary) hypertension: Secondary | ICD-10-CM
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
## 2023-09-01 ENCOUNTER — Other Ambulatory Visit: Payer: Self-pay | Admitting: Family Medicine

## 2023-09-10 ENCOUNTER — Other Ambulatory Visit: Payer: Self-pay | Admitting: Family Medicine

## 2023-09-10 DIAGNOSIS — Z1231 Encounter for screening mammogram for malignant neoplasm of breast: Secondary | ICD-10-CM

## 2023-09-27 ENCOUNTER — Other Ambulatory Visit: Payer: Self-pay | Admitting: Family Medicine

## 2023-10-09 ENCOUNTER — Telehealth: Payer: Self-pay | Admitting: *Deleted

## 2023-10-09 ENCOUNTER — Ambulatory Visit (INDEPENDENT_AMBULATORY_CARE_PROVIDER_SITE_OTHER): Payer: 59 | Admitting: Family Medicine

## 2023-10-09 ENCOUNTER — Encounter: Payer: Self-pay | Admitting: Family Medicine

## 2023-10-09 VITALS — BP 116/80 | HR 60 | Temp 97.7°F | Ht 64.0 in | Wt 180.8 lb

## 2023-10-09 DIAGNOSIS — Z8639 Personal history of other endocrine, nutritional and metabolic disease: Secondary | ICD-10-CM | POA: Diagnosis not present

## 2023-10-09 DIAGNOSIS — M810 Age-related osteoporosis without current pathological fracture: Secondary | ICD-10-CM | POA: Diagnosis not present

## 2023-10-09 DIAGNOSIS — E669 Obesity, unspecified: Secondary | ICD-10-CM

## 2023-10-09 DIAGNOSIS — I10 Essential (primary) hypertension: Secondary | ICD-10-CM | POA: Diagnosis not present

## 2023-10-09 DIAGNOSIS — Z1211 Encounter for screening for malignant neoplasm of colon: Secondary | ICD-10-CM

## 2023-10-09 DIAGNOSIS — Z0001 Encounter for general adult medical examination with abnormal findings: Secondary | ICD-10-CM | POA: Diagnosis not present

## 2023-10-09 DIAGNOSIS — Z23 Encounter for immunization: Secondary | ICD-10-CM | POA: Diagnosis not present

## 2023-10-09 DIAGNOSIS — F4322 Adjustment disorder with anxiety: Secondary | ICD-10-CM | POA: Diagnosis not present

## 2023-10-09 LAB — COMPREHENSIVE METABOLIC PANEL WITH GFR
ALT: 23 U/L (ref 0–35)
AST: 27 U/L (ref 0–37)
Albumin: 4.3 g/dL (ref 3.5–5.2)
Alkaline Phosphatase: 72 U/L (ref 39–117)
BUN: 21 mg/dL (ref 6–23)
CO2: 24 meq/L (ref 19–32)
Calcium: 9.1 mg/dL (ref 8.4–10.5)
Chloride: 107 meq/L (ref 96–112)
Creatinine, Ser: 0.93 mg/dL (ref 0.40–1.20)
GFR: 64.84 mL/min (ref >60.00)
Glucose, Bld: 90 mg/dL (ref 70–99)
Potassium: 4.2 meq/L (ref 3.5–5.1)
Sodium: 141 meq/L (ref 135–145)
Total Bilirubin: 1.3 mg/dL — ABNORMAL HIGH (ref 0.2–1.2)
Total Protein: 6.9 g/dL (ref 6.0–8.3)

## 2023-10-09 LAB — VITAMIN D 25 HYDROXY (VIT D DEFICIENCY, FRACTURES): VITD: 115.21 ng/mL (ref 30.00–100.00)

## 2023-10-09 LAB — CBC WITH DIFFERENTIAL/PLATELET
Basophils Absolute: 0 K/uL (ref 0.0–0.1)
Basophils Relative: 0.7 % (ref 0.0–3.0)
Eosinophils Absolute: 0 K/uL (ref 0.0–0.7)
Eosinophils Relative: 0.7 % (ref 0.0–5.0)
HCT: 43.5 % (ref 36.0–46.0)
Hemoglobin: 14.6 g/dL (ref 12.0–15.0)
Lymphocytes Relative: 30.3 % (ref 12.0–46.0)
Lymphs Abs: 1.5 K/uL (ref 0.7–4.0)
MCHC: 33.6 g/dL (ref 30.0–36.0)
MCV: 88.6 fl (ref 78.0–100.0)
Monocytes Absolute: 0.4 K/uL (ref 0.1–1.0)
Monocytes Relative: 9.3 % (ref 3.0–12.0)
Neutro Abs: 2.9 K/uL (ref 1.4–7.7)
Neutrophils Relative %: 59 % (ref 43.0–77.0)
Platelets: 247 K/uL (ref 150.0–400.0)
RBC: 4.91 Mil/uL (ref 3.87–5.11)
RDW: 14 % (ref 11.5–15.5)
WBC: 4.9 K/uL (ref 4.0–10.5)

## 2023-10-09 LAB — LIPID PANEL
Cholesterol: 190 mg/dL (ref 0–200)
HDL: 57.7 mg/dL (ref >39.00)
LDL Cholesterol: 114 mg/dL — ABNORMAL HIGH (ref 0–99)
NonHDL: 132.16
Total CHOL/HDL Ratio: 3
Triglycerides: 93 mg/dL (ref 0.0–149.0)
VLDL: 18.6 mg/dL (ref 0.0–40.0)

## 2023-10-09 LAB — TSH: TSH: 2.66 u[IU]/mL (ref 0.35–5.50)

## 2023-10-09 NOTE — Patient Instructions (Addendum)
 Please return in 12 months for your annual complete physical; please come fasting. For follow up on chronic medical conditions   I will release your lab results to you on your MyChart account with further instructions. You may see the results before I do, but when I review them I will send you a message with my report or have my assistant call you if things need to be discussed. Please reply to my message with any questions. Thank you!   If you have any questions or concerns, please don't hesitate to send me a message via MyChart or call the office at [phone]. Thank you for visiting with us  today! It's our pleasure caring for you.   Take care of yourself!! You deserve the best.

## 2023-10-09 NOTE — Telephone Encounter (Signed)
 CRITICAL VALUE STICKER  CRITICAL VALUE: Vit D= 115.21  RECEIVER (on-site recipient of call): Shatonya Passon  DATE & TIME NOTIFIED: 10/09/23 at 12:15  MESSENGER (representative from lab): Effie  MD NOTIFIED: Liborio

## 2023-10-09 NOTE — Progress Notes (Signed)
 Subjective  Chief Complaint  Patient presents with   Annual Exam    Pt here for Annual Exam and is currently fasting. Mammo is scheduled for the end of sept    Hypertension   Osteoporosis    HPI: Madison Ray is a 63 y.o. female who presents to [facility] [place] today for a Female Wellness Visit. She also has the concerns and/or needs as listed above in the chief complaint. These will be addressed in addition to the Health Maintenance Visit.   Wellness Visit: annual visit with health maintenance review and exam  Health maintenance: Eligible for flu and Prevnar 20 today.  Mammogram scheduled for later this week.  Eligible for colorectal cancer screening, negative Cologuard 3 years ago.  Average risk.  Pap smear current. Father passed away last month peacefully.  She is transitioning through her grief well.  Stressful but managing.  Her father suffered from severe dementia and is now at peace.  She is grateful.   Chronic disease f/u and/or acute problem visit: (deemed necessary to be done in addition to the wellness visit): Hypertension is very well-controlled with metoprolol  low-dose.  No chest pain palpitations.  No adverse effects. She has gained weight over the last several years, more so in the last year.  We did start Lexapro  last year which is contributing.  Diet changes and lack of exercise due to her very busy schedule taking care of her father has also contributed.  She is now ready to work on weight gain.  The weight gain does bother her. Osteoporosis with recent bone density showing worsening off of Fosamax  for 2 years.  She recently restarted and is tolerating it well.  On calcium and vitamin D .  She will start back at the gym for weight training as well.  She does take high-dose vitamin D  daily.  Borderline high levels in the past. Adjustment reaction with anxiety: Starting to feel a bit better now that she is not caregiving for her father.  Still stressful  going through his estate.  Also dealing with some family issues with her daughter.  Lexapro  significantly helped her mood, agitation and anxiety levels.  Rare use of Xanax  at this time.  Assessment  1. Encounter for well adult exam with abnormal findings   2. Need for influenza vaccination   3. Essential hypertension   4. Adjustment reaction with anxiety   5. Age-related osteoporosis without current pathological fracture   6. History of vitamin D  deficiency   7. Screening for colorectal cancer   8. Obesity (BMI 30-39.9)   9. Need for pneumococcal 20-valent conjugate vaccination      Plan  Female Wellness Visit: Age appropriate Health Maintenance and Prevention measures were discussed with patient. Included topics are cancer screening recommendations, ways to keep healthy (see AVS) including dietary and exercise recommendations, regular eye and dental care, use of seat belts, and avoidance of moderate alcohol use and tobacco use.  Mammo is scheduled.  Cologuard ordered. BMI: discussed patient's BMI and encouraged positive lifestyle modifications to help get to or maintain a target BMI. HM needs and immunizations were addressed and ordered. See below for orders. See HM and immunization section for updates.  Flu vaccine and Prevnar 20 given today. Routine labs and screening tests ordered including cmp, cbc and lipids where appropriate. Discussed recommendations regarding Vit D and calcium supplementation (see AVS)  Chronic disease management visit and/or acute problem visit: Hypertension is very well-controlled.  Continue metoprolol .  Check electrolytes and lipids she is fasting. Obesity: Discussed diet exercise and effects of Lexapro .  She will work on improving things. Adjustment reaction: Very well-controlled on Lexapro .  Given stressors remain at this time we will continue for the next 6 months and then reevaluate. Osteoporosis on Fosamax  and vitamin D  and calcium.  Recheck levels.  Add  weight training.  Follow up: 1 year for complete physical, sooner if needed for mood Orders Placed This Encounter  Procedures   Flu vaccine trivalent PF, 6mos and older(Flulaval,Afluria,Fluarix,Fluzone)   Pneumococcal conjugate vaccine 20-valent (Prevnar 20)   Cologuard   VITAMIN D  25 Hydroxy (Vit-D Deficiency, Fractures)   CBC with Differential/Platelet   Comprehensive metabolic panel with GFR   Lipid panel   TSH   No orders of the defined types were placed in this encounter.     Body mass index is 31.03 kg/m. Wt Readings from Last 3 Encounters:  10/09/23 180 lb 12.8 oz (82 kg)  05/22/23 176 lb 6.4 oz (80 kg)  02/14/23 178 lb 6.4 oz (80.9 kg)     Patient Active Problem List   Diagnosis Date Noted   History of vitamin D  deficiency 10/09/2023    Priority: Low   Obesity (BMI 30-39.9) 10/09/2023   Essential hypertension 11/14/2022    New dx 09/2022: started metoprolol .     Adjustment reaction with anxiety 11/14/2022    Dealing with elderly father/dementia, sister's terminal illness 09/2022. Started lexapro  and xanax     Benign heart murmur 09/28/2020   Osteoporosis 01/03/2018    DEXA 2014 t = -2.6 lowest; started fosamax  and imprved  DEXA 2016 t = -2.2 lowest, improved on fosamax   DEXA 02/2018 T = - 1.1 lowest, on fosamax . May consider a drug holiday. DEXA 10/2020 T=-1.8 lowest, radius, drug holiday. Recheck 2 years. DEXA 04/2023 T=-2.0, significant change from 2022, worse off of fosamax  x 2 years. Recommend restarting fosamax . Recheck dexa in 2 years.     Osteoarthritis of knees, bilateral 01/03/2018    Clinical diagnosis    Health Maintenance  Topic Date Due   Mammogram  10/16/2023   COVID-19 Vaccine (6 - 2025-26 season) 10/25/2023 (Originally 09/23/2023)   Fecal DNA (Cologuard)  11/29/2023   DEXA SCAN  04/10/2025   Cervical Cancer Screening (HPV/Pap Cotest)  09/28/2025   DTaP/Tdap/Td (2 - Td or Tdap) 01/04/2028   Pneumococcal Vaccine: 50+ Years  Completed    Influenza Vaccine  Completed   Hepatitis C Screening  Completed   HIV Screening  Completed   Zoster Vaccines- Shingrix  Completed   Hepatitis B Vaccines 19-59 Average Risk  Aged Out   HPV VACCINES  Aged Out   Meningococcal B Vaccine  Aged Out   Colonoscopy  Discontinued   Immunization History  Administered Date(s) Administered   Influenza, Quadrivalent, Recombinant, Inj, Pf 10/28/2017   Influenza, Seasonal, Injecte, Preservative Fre 10/09/2023   Influenza,inj,Quad PF,6+ Mos 11/01/2022   Influenza-Unspecified 09/26/2018, 10/27/2019, 10/27/2020, 11/01/2022   PFIZER(Purple Top)SARS-COV-2 Vaccination 03/21/2019, 04/13/2019, 11/24/2019, 05/03/2020   PNEUMOCOCCAL CONJUGATE-20 10/09/2023   Pfizer Covid-19 Vaccine Bivalent Booster 42yrs & up 11/01/2022   Tdap 01/03/2018   Zoster Recombinant(Shingrix) 11/08/2016, 01/08/2017   We updated and reviewed the patient's past history in detail and it is documented below. Allergies: Patient has no known allergies. Past Medical History Patient  has a past medical history of Anxiety, Arthritis, Essential hypertension (11/14/2022), Heart murmur, Osteoarthritis of knees, bilateral (01/03/2018), and Osteoporosis. Past Surgical History Patient  has no past surgical history on file.  Family History: Patient family history includes Arthritis in her mother and sister; COPD in her mother; Cancer in her maternal grandmother; Heart attack in her father; Hyperlipidemia in her father and mother; Hypertension in her father and mother; Hyperthyroidism in her daughter; Hypothyroidism in her mother; Stroke in her mother. Social History:  Patient  reports that she has never smoked. She has never used smokeless tobacco. She reports that she does not currently use alcohol. She reports that she does not use drugs.  Review of Systems: Constitutional: negative for fever or malaise Ophthalmic: negative for photophobia, double vision or loss of vision Cardiovascular:  negative for chest pain, dyspnea on exertion, or new LE swelling Respiratory: negative for SOB or persistent cough Gastrointestinal: negative for abdominal pain, change in bowel habits or melena Genitourinary: negative for dysuria or gross hematuria, no abnormal uterine bleeding or disharge Musculoskeletal: negative for new gait disturbance or muscular weakness Integumentary: negative for new or persistent rashes, no breast lumps Neurological: negative for TIA or stroke symptoms Psychiatric: negative for SI or delusions Allergic/Immunologic: negative for hives  Patient Care Team    Relationship Specialty Notifications Start End  Jodie Lavern CROME, MD PCP - General Family Medicine  01/03/18     Objective  Vitals: BP 116/80   Pulse 60   Temp 97.7 F (36.5 C)   Ht 5' 4 (1.626 m)   Wt 180 lb 12.8 oz (82 kg)   SpO2 96%   BMI 31.03 kg/m  General:  Well developed, well nourished, no acute distress  Psych:  Alert and orientedx3,normal mood and affect HEENT:  Normocephalic, atraumatic, non-icteric sclera,  supple neck without adenopathy, mass or thyromegaly Cardiovascular:  Normal S1, S2, RRR without gallop, rub or murmur Respiratory:  Good breath sounds bilaterally, CTAB with normal respiratory effort Gastrointestinal: normal bowel sounds, soft, non-tender, no noted masses. No HSM MSK: extremities without edema, joints without erythema or swelling Neurologic:    Mental status is normal.  Gross motor and sensory exams are normal.  No tremor  Commons side effects, risks, benefits, and alternatives for medications and treatment plan prescribed today were discussed, and the patient expressed understanding of the given instructions. Patient is instructed to call or message via MyChart if he/she has any questions or concerns regarding our treatment plan. No barriers to understanding were identified. We discussed Red Flag symptoms and signs in detail. Patient expressed understanding regarding what  to do in case of urgent or emergency type symptoms.  Medication list was reconciled, printed and provided to the patient in AVS. Patient instructions and summary information was reviewed with the patient as documented in the AVS. This note was prepared with assistance of Dragon voice recognition software. Occasional wrong-word or sound-a-like substitutions may have occurred due to the inherent limitations of voice recognition software

## 2023-10-12 ENCOUNTER — Ambulatory Visit: Payer: Self-pay | Admitting: Family Medicine

## 2023-10-12 NOTE — Progress Notes (Signed)
 Labs reviewed.  The 10-year ASCVD risk score (Arnett DK, et al., 2019) is: 5.3%   Values used to calculate the score:     Age: 63 years     Clincally relevant sex: Female     Is Non-Hispanic African American: No     Diabetic: No     Tobacco smoker: No     Systolic Blood Pressure: 116 mmHg     Is BP treated: Yes     HDL Cholesterol: 57.7 mg/dL     Total Cholesterol: 190 mg/dL

## 2023-10-18 ENCOUNTER — Ambulatory Visit
Admission: RE | Admit: 2023-10-18 | Discharge: 2023-10-18 | Disposition: A | Discharge status: Home / Self Care | Source: Ambulatory Visit | Attending: Family Medicine | Admitting: Family Medicine

## 2023-10-18 DIAGNOSIS — Z1231 Encounter for screening mammogram for malignant neoplasm of breast: Secondary | ICD-10-CM | POA: Diagnosis not present

## 2023-11-26 ENCOUNTER — Encounter: Payer: Self-pay | Admitting: Family Medicine

## 2024-02-07 ENCOUNTER — Encounter: Payer: Self-pay | Admitting: Family Medicine

## 2024-10-09 ENCOUNTER — Encounter: Admitting: Family Medicine
